# Patient Record
Sex: Female | Born: 1990 | Race: White | Hispanic: No | Smoking: Former smoker
Health system: Southern US, Community
[De-identification: ages and names within clinical notes are randomized; demographics above are authoritative.]

## PROBLEM LIST (undated history)

## (undated) DIAGNOSIS — F909 Attention-deficit hyperactivity disorder, unspecified type: Secondary | ICD-10-CM

## (undated) DIAGNOSIS — R51 Headache: Secondary | ICD-10-CM

## (undated) DIAGNOSIS — F419 Anxiety disorder, unspecified: Secondary | ICD-10-CM

## (undated) DIAGNOSIS — F32A Depression, unspecified: Secondary | ICD-10-CM

## (undated) DIAGNOSIS — E282 Polycystic ovarian syndrome: Secondary | ICD-10-CM

## (undated) DIAGNOSIS — F329 Major depressive disorder, single episode, unspecified: Secondary | ICD-10-CM

## (undated) DIAGNOSIS — R569 Unspecified convulsions: Secondary | ICD-10-CM

## (undated) HISTORY — PX: WISDOM TOOTH EXTRACTION: SHX21

---
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
## 2012-02-29 ENCOUNTER — Encounter (HOSPITAL_COMMUNITY): Payer: Self-pay | Admitting: *Deleted

## 2012-02-29 ENCOUNTER — Emergency Department (HOSPITAL_COMMUNITY)
Admission: EM | Admit: 2012-02-29 | Discharge: 2012-02-29 | Disposition: A | Discharge status: Home / Self Care | Payer: 59 | Attending: Emergency Medicine | Admitting: Emergency Medicine

## 2012-02-29 DIAGNOSIS — F411 Generalized anxiety disorder: Secondary | ICD-10-CM | POA: Insufficient documentation

## 2012-02-29 DIAGNOSIS — F329 Major depressive disorder, single episode, unspecified: Secondary | ICD-10-CM | POA: Insufficient documentation

## 2012-02-29 DIAGNOSIS — R109 Unspecified abdominal pain: Secondary | ICD-10-CM | POA: Insufficient documentation

## 2012-02-29 DIAGNOSIS — R339 Retention of urine, unspecified: Secondary | ICD-10-CM | POA: Insufficient documentation

## 2012-02-29 DIAGNOSIS — Z79899 Other long term (current) drug therapy: Secondary | ICD-10-CM | POA: Insufficient documentation

## 2012-02-29 DIAGNOSIS — R51 Headache: Secondary | ICD-10-CM | POA: Insufficient documentation

## 2012-02-29 DIAGNOSIS — J45909 Unspecified asthma, uncomplicated: Secondary | ICD-10-CM | POA: Insufficient documentation

## 2012-02-29 DIAGNOSIS — Z3202 Encounter for pregnancy test, result negative: Secondary | ICD-10-CM | POA: Insufficient documentation

## 2012-02-29 DIAGNOSIS — F17200 Nicotine dependence, unspecified, uncomplicated: Secondary | ICD-10-CM | POA: Insufficient documentation

## 2012-02-29 DIAGNOSIS — F3289 Other specified depressive episodes: Secondary | ICD-10-CM | POA: Insufficient documentation

## 2012-02-29 LAB — URINALYSIS, ROUTINE W REFLEX MICROSCOPIC
Hgb urine dipstick: NEGATIVE
Nitrite: NEGATIVE
Protein, ur: NEGATIVE mg/dL
Specific Gravity, Urine: 1.006 (ref 1.005–1.030)
Urobilinogen, UA: 0.2 mg/dL (ref 0.0–1.0)

## 2012-02-29 LAB — PREGNANCY, URINE: Preg Test, Ur: NEGATIVE

## 2012-02-29 MED ORDER — HYDROCODONE-ACETAMINOPHEN 5-325 MG PO TABS
2.0000 | ORAL_TABLET | Freq: Once | ORAL | Status: AC
  Administered 2012-02-29: 2 via ORAL
  Filled 2012-02-29: qty 2

## 2012-02-29 NOTE — ED Notes (Signed)
Pt did not wish to go home with catheter, requested removal. Explained that pt may need to return to have catheter place due to retention again and the risk for infection. Pt verbalized understanding and requested removal. Marlon Pel, PA made aware. Catheter removed.

## 2012-02-29 NOTE — ED Notes (Signed)
Pt. States she was able to void without any problems.

## 2012-02-29 NOTE — ED Provider Notes (Signed)
History     CSN: [id number]  Arrival date & time 02/29/12  1935   None     Chief Complaint  Patient presents with  . unable to void     (Consider location/radiation/quality/duration/timing/severity/associated sxs/prior treatment) HPI  Patient presents to the ER for urinary retention. She denies having this problem in the past. She takes Klonopin, Adder all, albuterol and Advil. No other medications. She has not urinated since last night and had the urge to urinate but was unable to. Denies being pregnant, denies having dysuria or foul odor to urine. Pt otherwise healthy.  Past Medical History  Diagnosis Date  . Asthma   . Depression   . Anxiety   . Headache     History reviewed. No pertinent past surgical history.  No family history on file.  History  Substance Use Topics  . Smoking status: Current Some Day Smoker -- 0.2 packs/day    Types: Cigarettes  . Smokeless tobacco: Never Used  . Alcohol Use: No    OB History    Grav Para Term Preterm Abortions TAB SAB Ect Mult Living                  Review of Systems  Genitourinary: Positive for difficulty urinating.  All other systems reviewed and are negative.    Allergies  Review of patient's allergies indicates no known allergies.  Home Medications   Current Outpatient Rx  Name  Route  Sig  Dispense  Refill  . ALBUTEROL SULFATE HFA 108 (90 BASE) MCG/ACT IN AERS   Inhalation   Inhale 2 puffs into the lungs every 6 (six) hours as needed. For shortness of breath         . AMPHETAMINE-DEXTROAMPHETAMINE 30 MG PO TABS   Oral   Take 30 mg by mouth 2 (two) times daily.           Marland Kitchen CLONAZEPAM 0.5 MG PO TABS   Oral   Take 1 mg by mouth 2 (two) times daily.          . IBUPROFEN 200 MG PO TABS   Oral   Take 200 mg by mouth every 6 (six) hours as needed. For pain           BP 117/65  Pulse 78  Temp 98 F (36.7 C) (Oral)  Resp 14  SpO2 98%  LMP 02/08/2012  Physical Exam  Nursing note and  vitals reviewed. Constitutional: She appears well-developed and well-nourished. No distress.  HENT:  Head: Normocephalic and atraumatic.  Eyes: Pupils are equal, round, and reactive to light.  Neck: Normal range of motion. Neck supple.  Cardiovascular: Normal rate and regular rhythm.   Pulmonary/Chest: Effort normal.  Abdominal: Soft. There is tenderness in the suprapubic area.  Neurological: She is alert.  Skin: Skin is warm and dry.    ED Course  Procedures (including critical care time)   Labs Reviewed  URINALYSIS, ROUTINE W REFLEX MICROSCOPIC  PREGNANCY, URINE  URINE CULTURE   No results found.   1. Urinary retention       MDM  Discussed issues medications with pharmacist. She says to the best of her knowledge Klonopin and Adderall do not cause urinary retention. Foley catheter and leg bag in place. Urinalysis and urine preg and now.  Patient had 500 mL of urine drained from her bladder. I have discussed with her that she will need to followup with urology did call their office tomorrow morning to schedule the appointment.  Patient is feeling much better and all her discomfort has resolved.  Pt has been advised of the symptoms that warrant their return to the ED. Patient has voiced understanding and has agreed to follow-up with the PCP or specialist.   Dorthula Matas, PA 02/29/12 2120  After discharge patient decided she did not want to take the leg back and foley with her. The nurse and I discussed how she may not be able to pee and will need to come back to the ER for this. She says that she understands and still wants it taken out. I had Garment/textile technologist, review all of the risks with patient. She chose to have it taken out. Pt urinated on her own before leaving hospital.  Dorthula Matas, PA 02/29/12 2216

## 2012-02-29 NOTE — ED Provider Notes (Signed)
Medical screening examination/treatment/procedure(s) were performed by non-physician practitioner and as supervising physician I was immediately available for consultation/collaboration.   Gwyneth Sprout, MD 02/29/12 [phone]

## 2012-02-29 NOTE — ED Notes (Signed)
The pt has had difficulty voiding  Since yesterday and the last time she voided  Was yesterday.  She is very uncomfortable.  No previous history.  lmp  Last month

## 2012-03-02 LAB — URINE CULTURE
Colony Count: NO GROWTH
Culture: NO GROWTH

## 2012-03-11 ENCOUNTER — Encounter (HOSPITAL_COMMUNITY): Payer: Self-pay | Admitting: Emergency Medicine

## 2012-03-11 ENCOUNTER — Emergency Department (HOSPITAL_COMMUNITY)
Admission: EM | Admit: 2012-03-11 | Discharge: 2012-03-11 | Disposition: A | Discharge status: Home / Self Care | Payer: 59 | Attending: Emergency Medicine | Admitting: Emergency Medicine

## 2012-03-11 DIAGNOSIS — J45909 Unspecified asthma, uncomplicated: Secondary | ICD-10-CM | POA: Insufficient documentation

## 2012-03-11 DIAGNOSIS — F411 Generalized anxiety disorder: Secondary | ICD-10-CM | POA: Insufficient documentation

## 2012-03-11 DIAGNOSIS — F1110 Opioid abuse, uncomplicated: Secondary | ICD-10-CM | POA: Insufficient documentation

## 2012-03-11 DIAGNOSIS — M7989 Other specified soft tissue disorders: Secondary | ICD-10-CM | POA: Insufficient documentation

## 2012-03-11 DIAGNOSIS — Z79899 Other long term (current) drug therapy: Secondary | ICD-10-CM | POA: Insufficient documentation

## 2012-03-11 DIAGNOSIS — Z8781 Personal history of (healed) traumatic fracture: Secondary | ICD-10-CM | POA: Insufficient documentation

## 2012-03-11 DIAGNOSIS — M238X9 Other internal derangements of unspecified knee: Secondary | ICD-10-CM

## 2012-03-11 DIAGNOSIS — M2350 Chronic instability of knee, unspecified knee: Secondary | ICD-10-CM | POA: Insufficient documentation

## 2012-03-11 DIAGNOSIS — F3289 Other specified depressive episodes: Secondary | ICD-10-CM | POA: Insufficient documentation

## 2012-03-11 DIAGNOSIS — F17200 Nicotine dependence, unspecified, uncomplicated: Secondary | ICD-10-CM | POA: Insufficient documentation

## 2012-03-11 DIAGNOSIS — G8929 Other chronic pain: Secondary | ICD-10-CM | POA: Insufficient documentation

## 2012-03-11 DIAGNOSIS — M549 Dorsalgia, unspecified: Secondary | ICD-10-CM | POA: Insufficient documentation

## 2012-03-11 DIAGNOSIS — Z8669 Personal history of other diseases of the nervous system and sense organs: Secondary | ICD-10-CM | POA: Insufficient documentation

## 2012-03-11 DIAGNOSIS — M25569 Pain in unspecified knee: Secondary | ICD-10-CM | POA: Insufficient documentation

## 2012-03-11 DIAGNOSIS — F329 Major depressive disorder, single episode, unspecified: Secondary | ICD-10-CM | POA: Insufficient documentation

## 2012-03-11 MED ORDER — TRAMADOL HCL 50 MG PO TABS: 50.0000 mg | ORAL_TABLET | Freq: Four times a day (QID) | ORAL | Status: DC | PRN

## 2012-03-11 MED ORDER — TRAMADOL HCL 50 MG PO TABS
50.0000 mg | ORAL_TABLET | Freq: Once | ORAL | Status: AC
  Administered 2012-03-11: 50 mg via ORAL
  Filled 2012-03-11: qty 1

## 2012-03-11 NOTE — ED Provider Notes (Signed)
Medical screening examination/treatment/procedure(s) were performed by non-physician practitioner and as supervising physician I was immediately available for consultation/collaboration.   Dione Booze, MD 03/11/12 [phone]

## 2012-03-11 NOTE — ED Provider Notes (Signed)
History   This chart was scribed for non-physician practitioner working with Shelby Booze, MD by Shelby Branch, ED Scribe. This patient was seen in room WTR8/WTR8 and the patient's care was started at 3:50 PM.    CSN: [id number]  Arrival date & time 03/11/12  1515   First MD Initiated Contact with Patient 03/11/12 1533      Chief Complaint  Patient presents with  . Knee Pain     The history is provided by the patient. No language interpreter was used.   Shelby Branch is a 22 y.o. female with h/o asthma, seizures and heroine abuse who presents to the Emergency Department complaining of constant, gradually worsening, throbbing and stabbing left knee pain that fluctuates between 6/10 and 8/10 in severity and does not radiate that began last night preventing sleep and has been notably worse today.  Pt states she tore her MCL 09/2011 falling down flight of stairs and has been performing physical therapy since but has not had surgical intervention, which she has been told is her only option for a full recovery (MRI 11/2011).  Pt denies any falls or re-injuries since 09/2011 and is unsure why pain worsened last night.   Pt reports associated swelling in right foot.  Pt denies recent fever and states she has chronic back pain that has not recently worsened.  Pt is a current someday smoker and denies alcohol use.  Past Medical History  Diagnosis Date  . Asthma   . Depression   . Anxiety   . Headache     History reviewed. No pertinent past surgical history.  No family history on file.  History  Substance Use Topics  . Smoking status: Current Some Day Smoker -- 0.2 packs/day    Types: Cigarettes  . Smokeless tobacco: Never Used  . Alcohol Use: No    No OB history provided.   Review of Systems  Constitutional: Negative for fever, diaphoresis, appetite change, fatigue and unexpected weight change.  HENT: Negative for mouth sores and neck stiffness.   Eyes: Negative for visual disturbance.   Respiratory: Negative for cough, chest tightness, shortness of breath and wheezing.   Cardiovascular: Negative for chest pain.  Gastrointestinal: Negative for nausea, vomiting, abdominal pain, diarrhea and constipation.  Genitourinary: Negative for dysuria, urgency, frequency and hematuria.  Musculoskeletal: Positive for joint swelling, arthralgias and gait problem (2/2 pain). Negative for back pain.  Skin: Negative for rash.  Neurological: Negative for syncope, light-headedness and headaches.  Hematological: Does not bruise/bleed easily.  Psychiatric/Behavioral: Negative for sleep disturbance. The patient is not nervous/anxious.   All other systems reviewed and are negative.    Allergies  Review of patient's allergies indicates no known allergies.  Home Medications   Current Outpatient Rx  Name  Route  Sig  Dispense  Refill  . AMPHETAMINE-DEXTROAMPHET ER 30 MG PO CP24   Oral   Take 30 mg by mouth 2 (two) times daily.         Marland Kitchen CLONAZEPAM 0.5 MG PO TABS   Oral   Take 0.5 mg by mouth 4 (four) times daily as needed. Anxiety.         Marland Kitchen DOXYLAMINE SUCCINATE (SLEEP) 25 MG PO TABS   Oral   Take 25 mg by mouth at bedtime as needed. Sleep.         . ALBUTEROL SULFATE HFA 108 (90 BASE) MCG/ACT IN AERS   Inhalation   Inhale 2 puffs into the lungs every 6 (six) hours as  needed. For shortness of breath         . TRAMADOL HCL 50 MG PO TABS   Oral   Take 1 tablet (50 mg total) by mouth every 6 (six) hours as needed for pain.   15 tablet   0     BP 100/78  Pulse 65  Temp 97.7 F (36.5 C)  Resp 16  SpO2 100%  LMP 02/08/2012  Physical Exam  Nursing note and vitals reviewed. Constitutional: She appears well-developed and well-nourished. No distress.  HENT:  Head: Normocephalic and atraumatic.  Mouth/Throat: Oropharynx is clear and moist. No oropharyngeal exudate.  Eyes: Conjunctivae normal are normal. No scleral icterus.  Neck: Normal range of motion. Neck supple.   Cardiovascular: Normal rate, regular rhythm, normal heart sounds and intact distal pulses.  Exam reveals no gallop and no friction rub.   No murmur heard. Pulses:      Dorsalis pedis pulses are 2+ on the left side.       Posterior tibial pulses are 2+ on the left side.       Capillary refill < 3 sec  Pulmonary/Chest: Effort normal and breath sounds normal. No respiratory distress. She has no wheezes.  Musculoskeletal: Normal range of motion. She exhibits no edema.       Pain to palpation in popliteal area without palpable swelling medial joint line tenderness to palpation No swelling of the L ankle   Neurological: She is alert.       Speech is clear and goal oriented Moves extremities without ataxia Full ROM of the Left toes, ankle and knee 5/5 strength in the ankle and knee of the LLE and including dorsiflexion and plantarflexion sensation intact in the LLE Increased laxity with valgus stress of the L knee  Skin: Skin is warm and dry. She is not diaphoretic.  Psychiatric: She has a normal mood and affect.    ED Course  Procedures (including critical care time) DIAGNOSTIC STUDIES: Oxygen Saturation is 100% on room air, normal by my interpretation.    COORDINATION OF CARE: 3:59 PM- Informed pt that knee XR will not be helpful today and that follow-up with orthopedist is necessary.  Discussed short-term pain medication.  Pt understands and agrees with plan.  1. MCL deficiency, knee   2. Knee pain       MDM  Shelby Branch presents with knee pain months after injury. Patient denies fall or trauma to cause increase in knee pain. Rebound and injury do not believe imaging is required at this time. We'll manage her pain here in the department and discharged home with Ultram for pain control. Discussed continuing her physical therapy along with RICE and stretching. Patient is to also continue using her brace. I also discussed the need for followup with orthopedist, Dr Althea Charon for  further evaluation.  1. Medications: ultram, usual home medications 2. Treatment: rest, drink plenty of fluids, RICE protocol 3. Follow Up: Please followup with your primary doctor for discussion of your diagnoses and further evaluation after today's visit; f/u with Dr Althea Charon for further evaluation of your knee  I personally performed the services described in this documentation, which was scribed in my presence. The recorded information has been reviewed and is accurate.      Dahlia Client Garlen Reinig, PA-C 03/11/12 [phone]

## 2012-03-11 NOTE — ED Notes (Signed)
Pt reports 7/10 L knee pain.  Pt reports that in aug 2013 she fell down a flight of stairs and tore her MCL.

## 2012-04-07 ENCOUNTER — Emergency Department (HOSPITAL_COMMUNITY)
Admission: EM | Admit: 2012-04-07 | Discharge: 2012-04-08 | Disposition: A | Discharge status: Home / Self Care | Payer: 59 | Attending: Emergency Medicine | Admitting: Emergency Medicine

## 2012-04-07 ENCOUNTER — Encounter (HOSPITAL_COMMUNITY): Payer: Self-pay | Admitting: *Deleted

## 2012-04-07 DIAGNOSIS — F411 Generalized anxiety disorder: Secondary | ICD-10-CM | POA: Insufficient documentation

## 2012-04-07 DIAGNOSIS — F329 Major depressive disorder, single episode, unspecified: Secondary | ICD-10-CM | POA: Insufficient documentation

## 2012-04-07 DIAGNOSIS — F3289 Other specified depressive episodes: Secondary | ICD-10-CM | POA: Insufficient documentation

## 2012-04-07 DIAGNOSIS — R21 Rash and other nonspecific skin eruption: Secondary | ICD-10-CM | POA: Insufficient documentation

## 2012-04-07 DIAGNOSIS — J45909 Unspecified asthma, uncomplicated: Secondary | ICD-10-CM | POA: Insufficient documentation

## 2012-04-07 DIAGNOSIS — N920 Excessive and frequent menstruation with regular cycle: Secondary | ICD-10-CM | POA: Insufficient documentation

## 2012-04-07 DIAGNOSIS — F17200 Nicotine dependence, unspecified, uncomplicated: Secondary | ICD-10-CM | POA: Insufficient documentation

## 2012-04-07 DIAGNOSIS — Z79899 Other long term (current) drug therapy: Secondary | ICD-10-CM | POA: Insufficient documentation

## 2012-04-07 NOTE — ED Notes (Addendum)
Pt states area does itch; also c/o heavy menstrual bleeding

## 2012-04-07 NOTE — ED Notes (Signed)
Pt c/o bumps all over; started in June when pt was using heroin; progressively gotten worse; hasn't used since June; has red scabbed areas on chest/back/face/legs

## 2012-04-08 LAB — POCT I-STAT, CHEM 8
Calcium, Ion: 1.25 mmol/L — ABNORMAL HIGH (ref 1.12–1.23)
Creatinine, Ser: 1 mg/dL (ref 0.50–1.10)
Glucose, Bld: 114 mg/dL — ABNORMAL HIGH (ref 70–99)
HCT: 35 % — ABNORMAL LOW (ref 36.0–46.0)
Hemoglobin: 11.9 g/dL — ABNORMAL LOW (ref 12.0–15.0)
TCO2: 29 mmol/L (ref 0–100)

## 2012-04-08 MED ORDER — PERMETHRIN 5 % EX CREA: TOPICAL_CREAM | CUTANEOUS | Status: DC

## 2012-04-08 MED ORDER — DIPHENHYDRAMINE HCL 25 MG PO CAPS
25.0000 mg | ORAL_CAPSULE | Freq: Once | ORAL | Status: AC
  Administered 2012-04-08: 25 mg via ORAL
  Filled 2012-04-08: qty 1

## 2012-04-08 NOTE — ED Provider Notes (Signed)
History     CSN: [id number]  Arrival date & time 04/07/12  2341   First MD Initiated Contact with Patient 04/08/12 0104      Chief Complaint  Patient presents with  . Rash    The history is provided by the patient.   patient reports developing a rash all over her arms chest and legs has been present since June of 2013.  She reports it itches.  She has pets that live in the house.  She lives in a house by herself.  She states she is off several phleboliths and this hasn't changed anything.  She has no history of eczema or asthma.  She also reports heavy menstrual cycle this week with more bleeding than normal.  She denies lower abdominal pain.  Symptoms are mild in severity.  Nothing worsens or improves her symptoms  Past Medical History  Diagnosis Date  . Asthma   . Depression   . Anxiety   . Headache     History reviewed. No pertinent past surgical history.  No family history on file.  History  Substance Use Topics  . Smoking status: Current Some Day Smoker -- 0.25 packs/day    Types: Cigarettes  . Smokeless tobacco: Never Used  . Alcohol Use: No    OB History   Grav Para Term Preterm Abortions TAB SAB Ect Mult Living                  Review of Systems  Skin: Positive for rash.  All other systems reviewed and are negative.    Allergies  Review of patient's allergies indicates no known allergies.  Home Medications   Current Outpatient Rx  Name  Route  Sig  Dispense  Refill  . amphetamine-dextroamphetamine (ADDERALL XR) 30 MG 24 hr capsule   Oral   Take 30 mg by mouth 2 (two) times daily.         . clonazePAM (KLONOPIN) 0.5 MG tablet   Oral   Take 0.5 mg by mouth 4 (four) times daily as needed. Anxiety.         Marland Kitchen HYDROcodone-ibuprofen (VICOPROFEN) 7.5-200 MG per tablet   Oral   Take 1 tablet by mouth every 4 (four) hours as needed for pain. For torn mcl         . naproxen sodium (ALEVE) 220 MG tablet   Oral   Take 440 mg by mouth 2 (two) times  daily as needed. For headache pain & bumps on back         . albuterol (PROVENTIL HFA;VENTOLIN HFA) 108 (90 BASE) MCG/ACT inhaler   Inhalation   Inhale 2 puffs into the lungs every 6 (six) hours as needed. For shortness of breath         . permethrin (ACTICIN) 5 % cream      Massage the cream into skin from the head to your feet Leave the permethrin cream on the skin for 8 to 14 hours. Wash off by taking a shower or bath. Put on clean clothes. Repeat in 1 week Launder all clothes, sheets and towels   60 g   0     BP 128/82  Pulse 98  Temp(Src) 98.5 F (36.9 C) (Oral)  Resp 15  SpO2 99%  LMP 04/07/2012  Physical Exam  Nursing note and vitals reviewed. Constitutional: She is oriented to person, place, and time. She appears well-developed and well-nourished. No distress.  HENT:  Head: Normocephalic and atraumatic.  Eyes: EOM  are normal.  Neck: Normal range of motion.  Cardiovascular: Normal rate, regular rhythm and normal heart sounds.   Pulmonary/Chest: Effort normal and breath sounds normal.  Abdominal: Soft. She exhibits no distension. There is no tenderness.  Musculoskeletal: Normal range of motion.  Neurological: She is alert and oriented to person, place, and time.  Skin: Skin is warm and dry.  Punctate sores all over her arms chest and back.  Evidence of excoriation.  No surrounding erythema  Psychiatric: She has a normal mood and affect. Judgment normal.    ED Course  Procedures (including critical care time)  Labs Reviewed  POCT I-STAT, CHEM 8 - Abnormal; Notable for the following:    Glucose, Bld 114 (*)    Calcium, Ion 1.25 (*)    Hemoglobin 11.9 (*)    HCT 35.0 (*)    All other components within normal limits  POCT PREGNANCY, URINE   No results found.   1. Rash   2. Menorrhagia       MDM  Nonspecific rash.  May represent fleas, scabies, bed bugs.  We'll treat the scabies.  Benadryl recommended.  Heavy menstrual cycle this month the urine  pregnancy is negative.  Vital signs are normal.  Hemoglobin demonstrates only very mild anemia        Lyanne Co, MD 04/08/12 [phone]

## 2012-04-08 NOTE — ED Notes (Signed)
Pt states she is unable to urinate at this time.

## 2012-04-18 ENCOUNTER — Emergency Department (HOSPITAL_COMMUNITY)
Admission: EM | Admit: 2012-04-18 | Discharge: 2012-04-18 | Disposition: A | Discharge status: Home / Self Care | Payer: 59 | Attending: Emergency Medicine | Admitting: Emergency Medicine

## 2012-04-18 DIAGNOSIS — R569 Unspecified convulsions: Secondary | ICD-10-CM

## 2012-04-18 DIAGNOSIS — G40909 Epilepsy, unspecified, not intractable, without status epilepticus: Secondary | ICD-10-CM | POA: Insufficient documentation

## 2012-04-18 DIAGNOSIS — F411 Generalized anxiety disorder: Secondary | ICD-10-CM | POA: Insufficient documentation

## 2012-04-18 DIAGNOSIS — Z8679 Personal history of other diseases of the circulatory system: Secondary | ICD-10-CM | POA: Insufficient documentation

## 2012-04-18 DIAGNOSIS — F329 Major depressive disorder, single episode, unspecified: Secondary | ICD-10-CM | POA: Insufficient documentation

## 2012-04-18 DIAGNOSIS — F17200 Nicotine dependence, unspecified, uncomplicated: Secondary | ICD-10-CM | POA: Insufficient documentation

## 2012-04-18 DIAGNOSIS — J45909 Unspecified asthma, uncomplicated: Secondary | ICD-10-CM | POA: Insufficient documentation

## 2012-04-18 DIAGNOSIS — Z79899 Other long term (current) drug therapy: Secondary | ICD-10-CM | POA: Insufficient documentation

## 2012-04-18 DIAGNOSIS — F3289 Other specified depressive episodes: Secondary | ICD-10-CM | POA: Insufficient documentation

## 2012-04-18 MED ORDER — LORAZEPAM 2 MG/ML IJ SOLN
1.0000 mg | Freq: Once | INTRAMUSCULAR | Status: AC
  Administered 2012-04-18: 1 mg via INTRAVENOUS
  Filled 2012-04-18: qty 1

## 2012-04-18 MED ORDER — CLONAZEPAM 0.5 MG PO TABS: 0.5000 mg | ORAL_TABLET | Freq: Four times a day (QID) | ORAL | Status: DC | PRN

## 2012-04-18 NOTE — ED Notes (Addendum)
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 Abrasions to face where her hands were during time of seizure. Has had one seizure before. CBG 98. 20 LAC. Postictal on arrival.

## 2012-04-18 NOTE — ED Notes (Signed)
WGN:FAOZ<HY> Expected date:<BR> Expected time:<BR> Means of arrival:<BR> Comments:<BR> seizure

## 2012-04-19 NOTE — ED Provider Notes (Signed)
History     CSN: [id number]  Arrival date & time 04/18/12  1636   First MD Initiated Contact with Patient 04/18/12 1653      Chief Complaint  Patient presents with  . Seizures    The history is provided by medical records and the patient.   the patient has a history of benzodiazepine withdrawal seizures.  She presents today with a seizure after not having any benzodiazepines in the past 48 hours.  Her blood sugar for EMS was 98.  The patient feels much better at this time.  She denies neck pain or headache.  No weakness of her upper lower extremities.  She does report a small bite to her left tongue.  She has no other complaints.  She was hospitalized once before for similar episode.  At that time she seen by neurology and this is thought to be benzodiazepine withdrawal seizure.  Her EEG was without any epileptic forms.  The patient's symptoms Result  Past Medical History  Diagnosis Date  . Asthma   . Depression   . Anxiety   . Headache     No past surgical history on file.  No family history on file.  History  Substance Use Topics  . Smoking status: Current Some Day Smoker -- 0.25 packs/day    Types: Cigarettes  . Smokeless tobacco: Never Used  . Alcohol Use: No    OB History   Grav Para Term Preterm Abortions TAB SAB Ect Mult Living                  Review of Systems  All other systems reviewed and are negative.    Allergies  Review of patient's allergies indicates no known allergies.  Home Medications   Current Outpatient Rx  Name  Route  Sig  Dispense  Refill  . amphetamine-dextroamphetamine (ADDERALL XR) 30 MG 24 hr capsule   Oral   Take 30 mg by mouth 2 (two) times daily.         . clonazePAM (KLONOPIN) 0.5 MG tablet   Oral   Take 1 tablet (0.5 mg total) by mouth 4 (four) times daily as needed. Anxiety.   6 tablet   0     BP 113/68  Pulse 92  Temp(Src) 99.1 F (37.3 C) (Oral)  Resp 17  SpO2 97%  LMP 04/07/2012  Physical Exam  Nursing  note and vitals reviewed. Constitutional: She is oriented to person, place, and time. She appears well-developed and well-nourished. No distress.  HENT:  Head: Normocephalic and atraumatic.  Eyes: EOM are normal.  Neck: Normal range of motion.  Cardiovascular: Normal rate, regular rhythm and normal heart sounds.   Pulmonary/Chest: Effort normal and breath sounds normal.  Abdominal: Soft. She exhibits no distension. There is no tenderness.  Musculoskeletal: Normal range of motion.  Neurological: She is alert and oriented to person, place, and time.  Skin: Skin is warm and dry.  Psychiatric: She has a normal mood and affect. Judgment normal.    ED Course  Procedures (including critical care time)  Labs Reviewed - No data to display No results found.  Date: 04/19/2012  Rate: 101  Rhythm: normal sinus rhythm  QRS Axis: normal  Intervals: normal  ST/T Wave abnormalities: normal  Conduction Disutrbances: none  Narrative Interpretation:   Old EKG Reviewed: No significant changes noted     1. Seizure       MDM  This may be Bazley withdrawal seizure.  The patient is  on benzodiazepines at home.  She will be prescribed a very short course of Klonopin.  Ativan emergency apartment.  She feels much better at time of discharge.  No recent head trauma.  No indication for labs or imaging of her head.  Her prior EEG was negative and this is thought to be benzo withdrawal seizures in the past.        Lyanne Co, MD 04/19/12 [phone]

## 2012-08-27 ENCOUNTER — Emergency Department (HOSPITAL_COMMUNITY): Payer: 59

## 2012-08-27 ENCOUNTER — Observation Stay (HOSPITAL_COMMUNITY)
Admission: EM | Admit: 2012-08-27 | Discharge: 2012-08-30 | Disposition: A | Discharge status: Home / Self Care | Payer: 59 | Attending: Internal Medicine | Admitting: Internal Medicine

## 2012-08-27 ENCOUNTER — Encounter (HOSPITAL_COMMUNITY): Payer: Self-pay | Admitting: Emergency Medicine

## 2012-08-27 DIAGNOSIS — Z23 Encounter for immunization: Secondary | ICD-10-CM | POA: Insufficient documentation

## 2012-08-27 DIAGNOSIS — G40802 Other epilepsy, not intractable, without status epilepticus: Principal | ICD-10-CM | POA: Insufficient documentation

## 2012-08-27 DIAGNOSIS — F1310 Sedative, hypnotic or anxiolytic abuse, uncomplicated: Secondary | ICD-10-CM | POA: Insufficient documentation

## 2012-08-27 DIAGNOSIS — F1320 Sedative, hypnotic or anxiolytic dependence, uncomplicated: Secondary | ICD-10-CM

## 2012-08-27 DIAGNOSIS — F1110 Opioid abuse, uncomplicated: Secondary | ICD-10-CM | POA: Insufficient documentation

## 2012-08-27 DIAGNOSIS — R569 Unspecified convulsions: Secondary | ICD-10-CM | POA: Diagnosis present

## 2012-08-27 DIAGNOSIS — E876 Hypokalemia: Secondary | ICD-10-CM | POA: Insufficient documentation

## 2012-08-27 DIAGNOSIS — F329 Major depressive disorder, single episode, unspecified: Secondary | ICD-10-CM | POA: Diagnosis present

## 2012-08-27 DIAGNOSIS — W19XXXA Unspecified fall, initial encounter: Secondary | ICD-10-CM | POA: Insufficient documentation

## 2012-08-27 DIAGNOSIS — F3289 Other specified depressive episodes: Secondary | ICD-10-CM | POA: Insufficient documentation

## 2012-08-27 DIAGNOSIS — F411 Generalized anxiety disorder: Secondary | ICD-10-CM | POA: Insufficient documentation

## 2012-08-27 DIAGNOSIS — S0003XA Contusion of scalp, initial encounter: Secondary | ICD-10-CM | POA: Insufficient documentation

## 2012-08-27 DIAGNOSIS — F1910 Other psychoactive substance abuse, uncomplicated: Secondary | ICD-10-CM

## 2012-08-27 LAB — CBC WITH DIFFERENTIAL/PLATELET
Basophils Absolute: 0 10*3/uL (ref 0.0–0.1)
Basophils Relative: 0 % (ref 0–1)
Eosinophils Absolute: 0.1 10*3/uL (ref 0.0–0.7)
Eosinophils Relative: 1 % (ref 0–5)
Lymphs Abs: 1.5 10*3/uL (ref 0.7–4.0)
MCH: 30.3 pg (ref 26.0–34.0)
MCHC: 32.8 g/dL (ref 30.0–36.0)
MCV: 92.2 fL (ref 78.0–100.0)
Platelets: 245 10*3/uL (ref 150–400)
RDW: 13.2 % (ref 11.5–15.5)

## 2012-08-27 LAB — BASIC METABOLIC PANEL
Calcium: 6.4 mg/dL — CL (ref 8.4–10.5)
GFR calc Af Amer: >90 mL/min (ref >90)
GFR calc non Af Amer: >90 mL/min (ref >90)
Glucose, Bld: 63 mg/dL — ABNORMAL LOW (ref 70–99)
Sodium: 138 mEq/L (ref 135–145)

## 2012-08-27 LAB — URINE MICROSCOPIC-ADD ON

## 2012-08-27 LAB — URINALYSIS, ROUTINE W REFLEX MICROSCOPIC
Hgb urine dipstick: NEGATIVE
Protein, ur: NEGATIVE mg/dL
Urobilinogen, UA: 0.2 mg/dL (ref 0.0–1.0)

## 2012-08-27 LAB — PREGNANCY, URINE: Preg Test, Ur: NEGATIVE

## 2012-08-27 LAB — ETHANOL: Alcohol, Ethyl (B): <11 mg/dL (ref 0–11)

## 2012-08-27 LAB — RAPID URINE DRUG SCREEN, HOSP PERFORMED
Amphetamines: NOT DETECTED
Tetrahydrocannabinol: NOT DETECTED

## 2012-08-27 MED ORDER — SODIUM CHLORIDE 0.9 % IV SOLN
INTRAVENOUS | Status: AC
  Administered 2012-08-27: 20:00:00 via INTRAVENOUS

## 2012-08-27 MED ORDER — SODIUM CHLORIDE 0.9 % IV SOLN
1000.0000 mg | INTRAVENOUS | Status: AC
  Administered 2012-08-27: 1000 mg via INTRAVENOUS
  Filled 2012-08-27: qty 10

## 2012-08-27 MED ORDER — ALPRAZOLAM 0.25 MG PO TABS: 0.5000 mg | ORAL_TABLET | Freq: Four times a day (QID) | ORAL | Status: DC

## 2012-08-27 MED ORDER — ACETAMINOPHEN 325 MG PO TABS
650.0000 mg | ORAL_TABLET | Freq: Once | ORAL | Status: AC
  Administered 2012-08-27: 650 mg via ORAL
  Filled 2012-08-27: qty 2

## 2012-08-27 NOTE — ED Notes (Signed)
Pt unable to void at this time. 

## 2012-08-27 NOTE — ED Provider Notes (Addendum)
History    CSN: [id number] Arrival date & time 08/27/12  1652  First MD Initiated Contact with Patient 08/27/12 1722     Chief Complaint  Patient presents with  . Seizures   (Consider location/radiation/quality/duration/timing/severity/associated sxs/prior Treatment) HPI Comments: Shelby Branch is a 22 y.o. female who is here for evaluation of 2, witnessed seizures. The first one was last night at 1 AM and the second about 4 PM today. Each time. She had generalized tonic-clonic activity for 45 seconds followed by a 1-2 minute post to stay and a more prolonged confusional state. The first incident occurred while seated in a recliner. A second incident occurred while standing. She injured her head of the second episode. There were no preceding symptoms. She ran out of her Xanax 2 days ago because she took excess Xanax. She is prescribed Xanax for anxiety, by her psychiatrist. She's never tried weaning off the Xanax. She has had similar seizures, in the past. She denies recent illnesses. She any fever, chills, nausea, vomiting, weakness, or dizziness. There are no other known modifying factors.  Patient is a 22 y.o. female presenting with seizures. The history is provided by the patient and a parent.  Seizures  Past Medical History  Diagnosis Date  . Asthma   . Depression   . Anxiety   . Headache(784.0)    History reviewed. No pertinent past surgical history. History reviewed. No pertinent family history. History  Substance Use Topics  . Smoking status: Current Some Day Smoker -- 0.25 packs/day    Types: Cigarettes  . Smokeless tobacco: Never Used  . Alcohol Use: No   OB History   Grav Para Term Preterm Abortions TAB SAB Ect Mult Living                 Review of Systems  Neurological: Positive for seizures.    Allergies  Review of patient's allergies indicates no known allergies.  Home Medications   Current Outpatient Rx  Name  Route  Sig  Dispense  Refill  . ALPRAZolam  (XANAX) 0.5 MG tablet   Oral   Take 0.5 mg by mouth every 6 (six) hours as needed for anxiety.          BP 119/88  Pulse 83  Temp(Src) 97.5 F (36.4 C) (Oral)  Resp 16  SpO2 100%  LMP 06/27/2012 Physical Exam  Nursing note and vitals reviewed. Constitutional: She is oriented to person, place, and time. She appears well-developed and well-nourished.  HENT:  Head: Normocephalic.  No significant tongue abrasion. Contusion right parietal scalp, without bleeding.  Eyes: Conjunctivae and EOM are normal. Pupils are equal, round, and reactive to light.  Neck: Normal range of motion and phonation normal. Neck supple.  Cardiovascular: Normal rate, regular rhythm and intact distal pulses.   Pulmonary/Chest: Effort normal and breath sounds normal. She exhibits no tenderness.  Abdominal: Soft. She exhibits no distension. There is no tenderness. There is no guarding.  Musculoskeletal: Normal range of motion. She exhibits no edema and no tenderness.  Neurological: She is alert and oriented to person, place, and time. She has normal strength. She exhibits normal muscle tone.  Skin: Skin is warm and dry.  Psychiatric: Her behavior is normal. Judgment and thought content normal.  Appears depressed    ED Course  Procedures (including critical care time) Medications  0.9 %  sodium chloride infusion ( Intravenous New Bag/Given 08/27/12 1954)  acetaminophen (TYLENOL) tablet 650 mg (650 mg Oral Given 08/27/12 1955)  levETIRAcetam (KEPPRA) 1,000 mg in sodium chloride 0.9 % 100 mL IVPB (0 mg Intravenous Stopped 08/27/12 2014)    Patient Vitals for the past 24 hrs:  BP Temp Temp src Pulse Resp SpO2  08/27/12 1945 119/88 mmHg - - 83 16 100 %  08/27/12 1915 127/81 mmHg - - 77 11 98 %  08/27/12 1845 117/83 mmHg - - 75 14 99 %  08/27/12 1815 117/80 mmHg - - 73 11 97 %  08/27/12 1745 117/86 mmHg - - 75 13 99 %  08/27/12 1702 129/84 mmHg 97.5 F (36.4 C) Oral 100 18 95 %    9:15 PM Reevaluation with  update and discussion. After initial assessment and treatment, an updated evaluation reveals no seizure in ED, no further complaints. Tyresse Jayson L   5:41 PM-Consult complete with Dr. Cyril Mourning, Neurohospitalist. Patient case explained and discussed. He recommends starting Keppra as prophylaxis against seizures, and will see the patient, as needed. Call ended at 1745  9:15 PM-Consult complete with Admitting resident. Patient case explained and discussed. She agrees to admit patient for further evaluation and treatment. Call ended at 1941    Labs Reviewed  CBC WITH DIFFERENTIAL - Abnormal; Notable for the following:    RBC 3.47 (*)    Hemoglobin 10.5 (*)    HCT 32.0 (*)    All other components within normal limits  BASIC METABOLIC PANEL - Abnormal; Notable for the following:    Potassium 2.8 (*)    Glucose, Bld 63 (*)    BUN 5 (*)    Calcium 6.4 (*)    All other components within normal limits  URINALYSIS, ROUTINE W REFLEX MICROSCOPIC - Abnormal; Notable for the following:    APPearance CLOUDY (*)    Leukocytes, UA SMALL (*)    All other components within normal limits  URINE RAPID DRUG SCREEN (HOSP PERFORMED) - Abnormal; Notable for the following:    Opiates POSITIVE (*)    Benzodiazepines POSITIVE (*)    All other components within normal limits  URINE MICROSCOPIC-ADD ON - Abnormal; Notable for the following:    Squamous Epithelial / LPF MANY (*)    All other components within normal limits  ETHANOL  PREGNANCY, URINE   Ct Head Wo Contrast  08/27/2012   *RADIOLOGY REPORT*  Clinical Data: 22 year old female with seizure.  Right head injury.  CT HEAD WITHOUT CONTRAST  Technique:  Contiguous axial images were obtained from the base of the skull through the vertex without contrast.  Comparison: 12/30/2011.  Findings: Visualized orbit soft tissues are within normal limits. Broad-based right vertex scalp hematoma measuring up to 15 mm in thickness.  Underlying calvarium intact.  Other  scalp soft tissues are within normal limits.  Visualized paranasal sinuses and mastoids are clear.  Normal cerebral volume. No midline shift, ventriculomegaly, mass effect, evidence of mass lesion, intracranial hemorrhage or evidence of cortically based acute infarction.  Gray-white matter differentiation is within normal limits throughout the brain. Scattered small dural calcifications. No suspicious intracranial vascular hyperdensity.  IMPRESSION: 1.  Scalp hematoma without underlying fracture. 2.  Stable and normal noncontrast CT appearance of the brain.   Original Report Authenticated By: Erskine Speed, M.D.   1. Seizure   2. Substance abuse     MDM  Recurrent seizures, associated with sudden cessation of benzodiazepines. She may continue to have seizures. She will need medical admission before initiation of psychiatric care. Incidental hypokalemia. Will likely improve with better nutrition. I suspect polysubstance abuse since opiates  are positive, on urine drug screen.     Plan: Admit  Flint Melter, MD 08/27/12 2141  Flint Melter, MD 09/09/12 1005  Flint Melter, MD 09/09/12 [phone]

## 2012-08-27 NOTE — ED Notes (Signed)
Pt with hx of seizure x 1 year ago had seizure from xanax withdraw; pt sts out of xanax x 2 days and had witnessed seizure last night and again today; pt alert but lethargic at present; pt admits to xanax misuse

## 2012-08-27 NOTE — H&P (Signed)
Date: 08/27/2012               Patient Name:  DARNICE COMRIE MRN: [id number]  DOB: 08-Aug-1990 Age / Sex: 22 y.o., female   PCP: Corwin Levins, MD         Medical Service: Internal Medicine Teaching Service         Attending Physician: Dr. Jonah Blue, DO    First Contact: Dr. Mariea Clonts Pager: [phone]  Second Contact: Dr. Everardo Beals Pager: [phone]       After Hours (After 5p/  First Contact Pager: [phone]  weekends / holidays): Second Contact Pager: [phone]   Chief Complaint: 2 witnessed seizures  History of Present Illness:  Ms. KADIJA CRUZEN is a 22 y.o. y/o female w/ PMHx of anxiety, depression, and asthma, presents to the ED after 2 recent witnessed seizures starting last night. Her first seizure was last night, she was sleeping in a recliner chair in the living room when her father said she gasped for air and became rigid and shaking, for a couple minutes or so. She had her second episode at around 4:30 PM today when she fell to the ground with similar symptoms, and hit her head on the concrete, resulting in a contusion w/ mild abrasion. She does not recall the events and denies any voiding of her bladder and bowels, no tongue biting or vomiting. She claims to be extremely fatigued after the episodes. She claims to have had 5 previous seizures before, all of which were similar in presentation. She has a history of Xanax abuse, which she takes for her anxiety, but now takes 4 mg a day. When she runs out of her Xanax, she has withdrawal seizures, according to the patient and her mother. Her last Xanax that she took was yesterday morning, when she only took 0.5 mg.  Patient also found to be +ve for opioids on UDS. She admits to taking one of her mother's Vicodin on Friday night. Patient has no other complaints at this time. Denies chest pain, SOB, fever, chills, motor or sensory abnormalities, diarrhea, constipation or abdominal pain. Her LMP was 2 months ago, but has always had a history of  being very irregular. Denies the possibility of pregnancy.  Mother was present in the room at time of admission as well and provided information for HPI.   Meds: Current Facility-Administered Medications  Medication Dose Route Frequency Provider Last Rate Last Dose  . 0.9 %  sodium chloride infusion   Intravenous Continuous Flint Melter, MD 125 mL/hr at 08/27/12 1954     Current Outpatient Prescriptions  Medication Sig Dispense Refill  . ALPRAZolam (XANAX) 0.5 MG tablet Take 0.5 mg by mouth every 6 (six) hours as needed for anxiety.        Allergies: Allergies as of 08/27/2012  . (No Known Allergies)   Past Medical History  Diagnosis Date  . Asthma   . Depression   . Anxiety   . Headache(784.0)    History reviewed. No pertinent past surgical history. History reviewed. No pertinent family history. History   Social History  . Marital Status: Single    Spouse Name: N/A    Number of Children: N/A  . Years of Education: N/A   Occupational History  . Not on file.   Social History Main Topics  . Smoking status: Current Some Day Smoker -- 0.25 packs/day    Types: Cigarettes  . Smokeless tobacco: Never Used  . Alcohol Use: No  .  Drug Use: Yes     Comment: heroine  . Sexually Active: Yes   Other Topics Concern  . Not on file   Social History Narrative  . No narrative on file   Review of Systems: General: Has significant fatigue. Denies fever, chills, diaphoresis, appetite change.  HEENT: Denies change in vision, eye pain, redness, hearing loss, congestion, sore throat, rhinorrhea, sneezing, mouth sores, trouble swallowing, neck pain, neck stiffness and tinnitus.   Respiratory: Denies SOB, DOE, cough, chest tightness, and wheezing.   Cardiovascular: Denies chest pain, palpitations and leg swelling.  Gastrointestinal: Denies nausea, vomiting, abdominal pain, diarrhea, constipation, blood in stool and abdominal distention.  Genitourinary: Denies dysuria, urgency,  frequency, hematuria, flank pain and difficulty urinating.  Endocrine: Denies hot or cold intolerance, sweats, polyuria, polydipsia. Musculoskeletal: Denies myalgias, back pain, joint swelling, arthralgias and gait problem.  Skin: Denies pallor, rash and wounds.  Neurological: 5 previous seizures in the past, headache.   Denies dizziness, syncope, weakness, lightheadedness, numbness.   Hematological: Denies adenopathy,easy bruising, personal or family bleeding history.  Psychiatric/Behavioral: Has a history of anxiety and depression.  Physical Exam: Blood pressure 126/86, pulse 75, temperature 97.5 F (36.4 C), temperature source Oral, resp. rate 16, last menstrual period 06/27/2012, SpO2 98.00%. Filed Vitals:   08/27/12 1845 08/27/12 1915 08/27/12 1945 08/27/12 2330  BP: 117/83 127/81 119/88 126/86  Pulse: 75 77 83 75  Temp:      TempSrc:      Resp: 14 11 16 16   SpO2: 99% 98% 100% 98%   General: Vital signs reviewed.  Patient is a well-developed and well-nourished, in no acute distress and cooperative with exam. Alert and oriented x3.  Head: Normocephalic with contusion/abrasion on upper/posterior part of skull. Nose: No erythema or drainage noted.  Turbinates normal. Mouth: No erythema, exudates, sores, or ulcerations. Moist mucus membranes. Eyes: PERRL, EOMI, conjunctivae normal, No scleral icterus.  Neck: Supple, trachea midline, normal ROM, No JVD, masses, thyromegaly, or carotid bruit present.  Cardiovascular: RRR, S1 normal, S2 normal, no murmurs, gallops, or rubs. Pulmonary/Chest: normal respiratory effort, CTAB, no wheezes, rales, or rhonchi. Abdominal: Soft. Non-tender, non-distended, bowel sounds are normal, no masses, organomegaly, or guarding present.  GU: No CVA tenderness. Musculoskeletal: No joint deformities, erythema, or stiffness, ROM full and no nontender. Extremities: No swelling or edema,  pulses symmetric and intact bilaterally. No cyanosis or  clubbing. Hematology: no cervical, inginal, or axillary adenopathy.  Neurological: A&O x3, Strength is normal and symmetric bilaterally, cranial nerve II-XII are grossly intact, no focal motor deficit, sensory intact to light touch bilaterally.  Skin: Warm, dry and intact. No rashes or erythema. Psychiatric: Normal mood w/ dull affect. speech and behavior is normal. Cognition and memory are normal.   Lab results: Basic Metabolic Panel:  Recent Labs  09/81/19 1820  NA 138  K 2.8*  CL 110  CO2 23  GLUCOSE 63*  BUN 5*  CREATININE 0.52  CALCIUM 6.4*   CBC:  Recent Labs  08/27/12 1820  WBC 9.1  NEUTROABS 6.9  HGB 10.5*  HCT 32.0*  MCV 92.2  PLT 245   Urine Drug Screen: Drugs of Abuse     Component Value Date/Time   LABOPIA POSITIVE* 08/27/2012 1932   COCAINSCRNUR NONE DETECTED 08/27/2012 1932   LABBENZ POSITIVE* 08/27/2012 1932   AMPHETMU NONE DETECTED 08/27/2012 1932   THCU NONE DETECTED 08/27/2012 1932   LABBARB NONE DETECTED 08/27/2012 1932    Alcohol Level:  Recent Labs  08/27/12 1820  ETH <  11   Urinalysis:  Recent Labs  08/27/12 1932  COLORURINE YELLOW  LABSPEC 1.011  PHURINE 7.0  GLUCOSEU NEGATIVE  HGBUR NEGATIVE  BILIRUBINUR NEGATIVE  KETONESUR NEGATIVE  PROTEINUR NEGATIVE  UROBILINOGEN 0.2  NITRITE NEGATIVE  LEUKOCYTESUR SMALL*   Imaging results:  Ct Head Wo Contrast  08/27/2012   *RADIOLOGY REPORT*  Clinical Data: 22 year old female with seizure.  Right head injury.  CT HEAD WITHOUT CONTRAST  Technique:  Contiguous axial images were obtained from the base of the skull through the vertex without contrast.  Comparison: 12/30/2011.  Findings: Visualized orbit soft tissues are within normal limits. Broad-based right vertex scalp hematoma measuring up to 15 mm in thickness.  Underlying calvarium intact.  Other scalp soft tissues are within normal limits.  Visualized paranasal sinuses and mastoids are clear.  Normal cerebral volume. No midline shift,  ventriculomegaly, mass effect, evidence of mass lesion, intracranial hemorrhage or evidence of cortically based acute infarction.  Gray-white matter differentiation is within normal limits throughout the brain. Scattered small dural calcifications. No suspicious intracranial vascular hyperdensity.  IMPRESSION: 1.  Scalp hematoma without underlying fracture. 2.  Stable and normal noncontrast CT appearance of the brain.   Original Report Authenticated By: Erskine Speed, M.D.   Assessment & Plan by Problem: Ms. Berrian is a 23 y/o female w/ PMHx of anxiety, depression, and asthma, admitted for 2 witnessed tonic-clonic seizures likely secondary to benzodiazepine withdrawal.  History of similar admissions.   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 Her last Xanax was 0.5 mg yesterday morning and her normal daily dosage is 2 mg daily; however, she admits to taking more than her prescribed dose, up to 4mg  qd.  Given loading dose of Keppra 1000 mg IVPB for seizure prophylaxis per neuro in ED. -Discussed case w/ neurology over the phone, will continue home dose Xanax at this time.  But consider transitioning to long acting benzo vs. Gradual tapering of current xanax.  -Consider EEG, although patient denies h/o seizure disorder and has had recent hospitalizations secondary to benzo w/drawal as likely etiology -Frequent neuro checks  Scalp hematoma on CT head on admission. Secondary to fall s/p tonic clonic seizures.  Complains of mild headache.   -Give Tylenol 650 mg PRN pain -Dry dressing  Hypokalemia, found to be 2.8 in the ED.  -Repeat BMET and mag. If still low, replete as necessary.  Hypocalcemia d/t possible hypoalbuminemia, hormone disregulation, or severe Vitamin D deficiency. This could also be a contributing factor to her recent seizures. -PTH, Vit D, CMET, and ionized Calcium,  phos, and mag ordered.  Polysubstance abuse; found to be +ve for opioids and benzos on UDS on admission. She admits to taking her mother's Vicodin on Friday evening. Also discussed thoughts of self harm and patient denied. -Consider psych consult vs close follow up as outpatient -Will need further counseling for substance abuse.  Diet: Regular  DVT prophylaxis: Lovenox  Dispo: Disposition is deferred at this time, awaiting improvement of current medical problems. Anticipated discharge in approximately 1-2 day(s).   The patient does have a current PCP Corwin Levins, MD) and does not need an [facility] hospital follow-up appointment after discharge.  The patient does not have transportation limitations that hinder transportation to clinic appointments.  Signed: Lars Masson, MD 08/27/2012, 11:38 PM

## 2012-08-28 ENCOUNTER — Encounter (HOSPITAL_COMMUNITY): Payer: Self-pay

## 2012-08-28 DIAGNOSIS — R569 Unspecified convulsions: Secondary | ICD-10-CM

## 2012-08-28 LAB — COMPREHENSIVE METABOLIC PANEL
AST: 14 U/L (ref 0–37)
Albumin: 3.2 g/dL — ABNORMAL LOW (ref 3.5–5.2)
Calcium: 8.9 mg/dL (ref 8.4–10.5)
Chloride: 101 mEq/L (ref 96–112)
Creatinine, Ser: 0.58 mg/dL (ref 0.50–1.10)
Sodium: 135 mEq/L (ref 135–145)

## 2012-08-28 LAB — CBC
MCV: 91.9 fL (ref 78.0–100.0)
Platelets: 245 10*3/uL (ref 150–400)
RDW: 13.1 % (ref 11.5–15.5)
WBC: 9.3 10*3/uL (ref 4.0–10.5)

## 2012-08-28 MED ORDER — ACETAMINOPHEN 325 MG PO TABS
650.0000 mg | ORAL_TABLET | Freq: Four times a day (QID) | ORAL | Status: DC | PRN
  Administered 2012-08-28 – 2012-08-29 (×2): 650 mg via ORAL
  Filled 2012-08-28 (×3): qty 2

## 2012-08-28 MED ORDER — ALPRAZOLAM 0.5 MG PO TABS
0.5000 mg | ORAL_TABLET | ORAL | Status: AC
  Administered 2012-08-28: 0.5 mg via ORAL
  Filled 2012-08-28: qty 1

## 2012-08-28 MED ORDER — ONDANSETRON HCL 4 MG/2ML IJ SOLN: 4.0000 mg | Freq: Four times a day (QID) | INTRAMUSCULAR | Status: DC | PRN

## 2012-08-28 MED ORDER — PNEUMOCOCCAL VAC POLYVALENT 25 MCG/0.5ML IJ INJ
0.5000 mL | INJECTION | INTRAMUSCULAR | Status: AC
  Administered 2012-08-28: 0.5 mL via INTRAMUSCULAR
  Filled 2012-08-28: qty 0.5

## 2012-08-28 MED ORDER — ENOXAPARIN SODIUM 40 MG/0.4ML ~~LOC~~ SOLN
40.0000 mg | SUBCUTANEOUS | Status: DC
  Administered 2012-08-29: 40 mg via SUBCUTANEOUS
  Filled 2012-08-28 (×3): qty 0.4

## 2012-08-28 MED ORDER — ACETAMINOPHEN 650 MG RE SUPP: 650.0000 mg | Freq: Four times a day (QID) | RECTAL | Status: DC | PRN

## 2012-08-28 MED ORDER — ALPRAZOLAM 0.5 MG PO TABS
0.5000 mg | ORAL_TABLET | Freq: Four times a day (QID) | ORAL | Status: DC
  Administered 2012-08-28 (×2): 0.5 mg via ORAL
  Filled 2012-08-28 (×2): qty 1

## 2012-08-28 MED ORDER — ONDANSETRON HCL 4 MG PO TABS: 4.0000 mg | ORAL_TABLET | Freq: Four times a day (QID) | ORAL | Status: DC | PRN

## 2012-08-28 MED ORDER — CLONAZEPAM 0.5 MG PO TABS
0.5000 mg | ORAL_TABLET | Freq: Three times a day (TID) | ORAL | Status: DC
  Administered 2012-08-28 – 2012-08-30 (×5): 0.5 mg via ORAL
  Filled 2012-08-28 (×6): qty 1

## 2012-08-28 NOTE — Consult Note (Addendum)
NEURO HOSPITALIST CONSULT NOTE    Reason for Consult:seizures  HPI:                                                                                                                                          Shelby Branch is an 22 y.o. female with a past medical history significant for depression, anxiety, asthma, episodic migraine, and recurrent seizures with a semiology that I am going to describe below. According to her mother, Shelby Branch had " multiple seizures when she was about 13 months ago" and she is absolutely positive that they were not febrile seizures " but some type of virus that caused the seizures". Did not have further paroxysmal events concerning for seizures until approximately 2 years ago when she started using xanax for her mood disorder. Mother said that she is aware of 5 seizures total, all of then generalized convulsions that last for 1-2 minutes and afterwards she is confused, disoriented, and amnestic for the episode. Denies having any warning before the seizure, " they just happen within 24 hours of stopping using xanax". Never had seizures during sleep. No admission to the hospital on status epilepticus. Denies having other type of spells. Regarding her seizures yesterday, she indicated that " it is the same thing that always happen, I did nit have the xanax and then had seizures". She injured her occiput with these seizures yesterday. Received I gram IV keppra in the ED. No further seizures. They report no history of febrile seizures but " many seizures probably due to a virus". No CNS infection, severe head injury, or stroke. She was born full term, product of a normal pregnancy and delivery, no neonatal/perinatal complications. Normal development. No family history of epilepsy. CT brain in the ED showed no acute intracranial abnormality. Presently, denies headache, vertigo, double vision, unsteadiness, confusion, focal weakness or numbness, slurred  speech, language or vision impairment.     Past Medical History  Diagnosis Date  . Asthma   . Depression   . Anxiety   . Headache(784.0)     History reviewed. No pertinent past surgical history.  History reviewed. No pertinent family history.  Family History: no epilepsy.   Social History:  reports that she has been smoking Cigarettes.  She has been smoking about 0.25 packs per day. She has never used smokeless tobacco. She reports that she uses illicit drugs. She reports that she does not drink alcohol.  No Known Allergies  MEDICATIONS:  I have reviewed the patient's current medications.   ROS:                                                                                                                                       History obtained from the patient, mother, and chart review.  General ROS: negative for - chills, fatigue, fever, night sweats, weight gain or weight loss Psychological ROS: negative for - behavioral disorder, hallucinations, memory difficulties, or suicidal ideation Ophthalmic ROS: negative for - blurry vision, double vision, eye pain or loss of vision ENT ROS: negative for - epistaxis, nasal discharge, oral lesions, sore throat, tinnitus or vertigo Allergy and Immunology ROS: negative for - hives or itchy/watery eyes Hematological and Lymphatic ROS: negative for - bleeding problems, bruising or swollen lymph nodes Endocrine ROS: negative for - galactorrhea, hair pattern changes, polydipsia/polyuria or temperature intolerance Respiratory ROS: negative for - cough, hemoptysis, shortness of breath or wheezing Cardiovascular ROS: negative for - chest pain, dyspnea on exertion, edema or irregular heartbeat Gastrointestinal ROS: negative for - abdominal pain, diarrhea, hematemesis, nausea/vomiting or stool incontinence Genito-Urinary ROS:  negative for - dysuria, hematuria, incontinence or urinary frequency/urgency Musculoskeletal ROS: negative for - joint swelling or muscular weakness Neurological ROS: as noted in HPI Dermatological ROS: negative for rash and skin lesion changes    Physical exam: pleasant female in no apparent distress. Blood pressure 118/68, pulse 80, temperature 98.3 F (36.8 C), temperature source Oral, resp. rate 18, height 5\' 4"  (1.626 m), weight 61.145 kg (134 lb 12.8 oz), last menstrual period 06/27/2012, SpO2 100.00%. Head: normocephalic. Neck: supple, no bruits, no JVD. Cardiac: no murmurs. Lungs: clear. Abdomen: soft, no tender, no mass. Extremities: no edema.  Neurologic Examination:                                                                                                      Mental Status: Alert, awake, oriented x 4, thought content appropriate. Comprehension, naming, and repetition intact. Speech fluent without evidence of aphasia.  Able to follow 3 step commands without difficulty. Cranial Nerves: II: Discs flat bilaterally; Visual fields grossly normal, pupils equal, round, reactive to light and accommodation III,IV, VI: ptosis not present, extra-ocular motions intact bilaterally V,VII: smile symmetric, facial light touch sensation normal bilaterally VIII: hearing normal bilaterally IX,X: gag reflex present XI: bilateral shoulder shrug XII: midline tongue extension Motor: Right : Upper extremity   5/5    Left:     Upper extremity   5/5  Lower extremity   5/5     Lower extremity   5/5 Tone and bulk:normal tone throughout; no atrophy noted Sensory: Pinprick and light touch intact throughout, bilaterally Deep Tendon Reflexes:  2+ all over Plantars: Right: downgoing   Left: downgoing Cerebellar: normal finger-to-nose,  normal heel-to-shin test Gait: No ataxia. CV: pulses palpable throughout    Lab Results  Component Value Date/Time   CHOL 175 05/02/2008  1:54 PM     Results for orders placed during the hospital encounter of 08/27/12 (from the past 48 hour(s))  CBC WITH DIFFERENTIAL     Status: Abnormal   Collection Time    08/27/12  6:20 PM      Result Value Range   WBC 9.1  4.0 - 10.5 K/uL   RBC 3.47 (*) 3.87 - 5.11 MIL/uL   Hemoglobin 10.5 (*) 12.0 - 15.0 g/dL   HCT 45.4 (*) 09.8 - 11.9 %   MCV 92.2  78.0 - 100.0 fL   MCH 30.3  26.0 - 34.0 pg   MCHC 32.8  30.0 - 36.0 g/dL   RDW 14.7  82.9 - 56.2 %   Platelets 245  150 - 400 K/uL   Neutrophils Relative % 76  43 - 77 %   Neutro Abs 6.9  1.7 - 7.7 K/uL   Lymphocytes Relative 17  12 - 46 %   Lymphs Abs 1.5  0.7 - 4.0 K/uL   Monocytes Relative 7  3 - 12 %   Monocytes Absolute 0.6  0.1 - 1.0 K/uL   Eosinophils Relative 1  0 - 5 %   Eosinophils Absolute 0.1  0.0 - 0.7 K/uL   Basophils Relative 0  0 - 1 %   Basophils Absolute 0.0  0.0 - 0.1 K/uL  BASIC METABOLIC PANEL     Status: Abnormal   Collection Time    08/27/12  6:20 PM      Result Value Range   Sodium 138  135 - 145 mEq/L   Potassium 2.8 (*) 3.5 - 5.1 mEq/L   Chloride 110  96 - 112 mEq/L   CO2 23  19 - 32 mEq/L   Glucose, Bld 63 (*) 70 - 99 mg/dL   BUN 5 (*) 6 - 23 mg/dL   Creatinine, Ser 1.30  0.50 - 1.10 mg/dL   Calcium 6.4 (*) 8.4 - 10.5 mg/dL   Comment: CRITICAL RESULT CALLED TO, READ BACK BY AND VERIFIED WITH:     [facility] 1920 08/27/12 M.CAMPBELL   GFR calc non Af Amer >90  >90 mL/min   GFR calc Af Amer >90  >90 mL/min   Comment:            The eGFR has been calculated     using the CKD EPI equation.     This calculation has not been     validated in all clinical     situations.     eGFR's persistently     <90 mL/min signify     possible Chronic Kidney Disease.  ETHANOL     Status: None   Collection Time    08/27/12  6:20 PM      Result Value Range   Alcohol, Ethyl (B) <11  0 - 11 mg/dL   Comment:            LOWEST DETECTABLE LIMIT FOR     SERUM ALCOHOL IS 11 mg/dL     FOR MEDICAL PURPOSES ONLY   URINALYSIS, ROUTINE W REFLEX  MICROSCOPIC     Status: Abnormal   Collection Time    08/27/12  7:32 PM      Result Value Range   Color, Urine YELLOW  YELLOW   APPearance CLOUDY (*) CLEAR   Specific Gravity, Urine 1.011  1.005 - 1.030   pH 7.0  5.0 - 8.0   Glucose, UA NEGATIVE  NEGATIVE mg/dL   Hgb urine dipstick NEGATIVE  NEGATIVE   Bilirubin Urine NEGATIVE  NEGATIVE   Ketones, ur NEGATIVE  NEGATIVE mg/dL   Protein, ur NEGATIVE  NEGATIVE mg/dL   Urobilinogen, UA 0.2  0.0 - 1.0 mg/dL   Nitrite NEGATIVE  NEGATIVE   Leukocytes, UA SMALL (*) NEGATIVE  URINE RAPID DRUG SCREEN (HOSP PERFORMED)     Status: Abnormal   Collection Time    08/27/12  7:32 PM      Result Value Range   Opiates POSITIVE (*) NONE DETECTED   Cocaine NONE DETECTED  NONE DETECTED   Benzodiazepines POSITIVE (*) NONE DETECTED   Amphetamines NONE DETECTED  NONE DETECTED   Tetrahydrocannabinol NONE DETECTED  NONE DETECTED   Barbiturates NONE DETECTED  NONE DETECTED   Comment:            DRUG SCREEN FOR MEDICAL PURPOSES     ONLY.  IF CONFIRMATION IS NEEDED     FOR ANY PURPOSE, NOTIFY LAB     WITHIN 5 DAYS.                LOWEST DETECTABLE LIMITS     FOR URINE DRUG SCREEN     Drug Class       Cutoff (ng/mL)     Amphetamine      1000     Barbiturate      200     Benzodiazepine   200     Tricyclics       300     Opiates          300     Cocaine          300     THC              50  PREGNANCY, URINE     Status: None   Collection Time    08/27/12  7:32 PM      Result Value Range   Preg Test, Ur NEGATIVE  NEGATIVE   Comment:            THE SENSITIVITY OF THIS     METHODOLOGY IS >20 mIU/mL.  URINE MICROSCOPIC-ADD ON     Status: Abnormal   Collection Time    08/27/12  7:32 PM      Result Value Range   Squamous Epithelial / LPF MANY (*) RARE   WBC, UA 0-2  <3 WBC/hpf  MAGNESIUM     Status: None   Collection Time    08/28/12  2:42 AM      Result Value Range   Magnesium 1.7  1.5 - 2.5 mg/dL  CBC     Status:  Abnormal   Collection Time    08/28/12  2:42 AM      Result Value Range   WBC 9.3  4.0 - 10.5 K/uL   RBC 3.60 (*) 3.87 - 5.11 MIL/uL   Hemoglobin 11.0 (*) 12.0 - 15.0 g/dL   HCT 16.1 (*) 09.6 - 04.5 %   MCV 91.9  78.0 - 100.0 fL   MCH 30.6  26.0 - 34.0 pg  MCHC 33.2  30.0 - 36.0 g/dL   RDW 16.1  09.6 - 04.5 %   Platelets 245  150 - 400 K/uL  COMPREHENSIVE METABOLIC PANEL     Status: Abnormal   Collection Time    08/28/12  2:42 AM      Result Value Range   Sodium 135  135 - 145 mEq/L   Potassium 3.7  3.5 - 5.1 mEq/L   Comment: DELTA CHECK NOTED   Chloride 101  96 - 112 mEq/L   Comment: DELTA CHECK NOTED   CO2 26  19 - 32 mEq/L   Glucose, Bld 126 (*) 70 - 99 mg/dL   BUN 6  6 - 23 mg/dL   Creatinine, Ser 4.09  0.50 - 1.10 mg/dL   Calcium 8.9  8.4 - 81.1 mg/dL   Total Protein 6.3  6.0 - 8.3 g/dL   Albumin 3.2 (*) 3.5 - 5.2 g/dL   AST 14  0 - 37 U/L   ALT 5  0 - 35 U/L   Alkaline Phosphatase 71  39 - 117 U/L   Total Bilirubin 0.2 (*) 0.3 - 1.2 mg/dL   GFR calc non Af Amer >90  >90 mL/min   GFR calc Af Amer >90  >90 mL/min   Comment:            The eGFR has been calculated     using the CKD EPI equation.     This calculation has not been     validated in all clinical     situations.     eGFR's persistently     <90 mL/min signify     possible Chronic Kidney Disease.  CALCIUM, IONIZED     Status: Abnormal   Collection Time    08/28/12  3:00 AM      Result Value Range   Calcium, Ion 1.31 (*) 1.12 - 1.23 mmol/L    Ct Head Wo Contrast  08/27/2012   *RADIOLOGY REPORT*  Clinical Data: 22 year old female with seizure.  Right head injury.  CT HEAD WITHOUT CONTRAST  Technique:  Contiguous axial images were obtained from the base of the skull through the vertex without contrast.  Comparison: 12/30/2011.  Findings: Visualized orbit soft tissues are within normal limits. Broad-based right vertex scalp hematoma measuring up to 15 mm in thickness.  Underlying calvarium intact.  Other  scalp soft tissues are within normal limits.  Visualized paranasal sinuses and mastoids are clear.  Normal cerebral volume. No midline shift, ventriculomegaly, mass effect, evidence of mass lesion, intracranial hemorrhage or evidence of cortically based acute infarction.  Gray-white matter differentiation is within normal limits throughout the brain. Scattered small dural calcifications. No suspicious intracranial vascular hyperdensity.  IMPRESSION: 1.  Scalp hematoma without underlying fracture. 2.  Stable and normal noncontrast CT appearance of the brain.   Original Report Authenticated By: Erskine Speed, M.D.     Assessment/Plan: 22 years old without known risk factors for epilepsy and history of recurrent GTC seizures with the above described semiology. Normal neuro-exam and unremarkable CT brain. She has had recurrent GTC seizures as an adult that seem to be very consistent with provoked xanax withdrawal seizures but at the same time there is a history of several apparently unprovoked seizures at age 72 months old, and thus I believe is prudent to pursue seizure work. In this regard, will recommend: 1) MRI brain with and without contrast. 2) EEG. 3) No anticonvulsants for now.  Will follow up.  Georga Hacking  Leroy Kennedy, MD [facility] [phone]  08/28/2012, 3:36 PM

## 2012-08-28 NOTE — Progress Notes (Signed)
Subjective: No complaints today. No episodes of seizures since admission. Mother present during rounds. Tendern area on scalp where px hit her head during the seizure.  Objective: Vital signs in last 24 hours: Filed Vitals:   08/28/12 0015 08/28/12 0200 08/28/12 1000 08/28/12 1432  BP: 115/76 106/68 113/75 118/68  Pulse: 70 71 80   Temp: 98.1 F (36.7 C) 98.2 F (36.8 C) 98.4 F (36.9 C) 98.3 F (36.8 C)  TempSrc:   Oral Oral  Resp: 16 16 18 18   Height:      Weight:      SpO2: 100% 99% 100% 100%   Weight change:   Intake/Output Summary (Last 24 hours) at 08/28/12 1507 Last data filed at 08/28/12 1300  Gross per 24 hour  Intake    360 ml  Output      0 ml  Net    360 ml   General appearance: alert, cooperative, appears stated age and no distress Head: Normocephalic, ~3 by 3 cm hematoma of the scalp in the sup occipital region. Lungs: clear to auscultation bilaterally Heart: regular rate and rhythm, S1, S2 normal, no murmur, click, rub or gallop Abdomen: soft, non-tender; bowel sounds normal; no masses,  no organomegaly Extremities: extremities normal, atraumatic, no cyanosis or edema Lab Results: Basic Metabolic Panel:  Recent Labs Lab 08/27/12 1820 08/28/12 0242  NA 138 135  K 2.8* 3.7  CL 110 101  CO2 23 26  GLUCOSE 63* 126*  BUN 5* 6  CREATININE 0.52 0.58  CALCIUM 6.4* 8.9  MG  --  1.7   Liver Function Tests:  Recent Labs Lab 08/28/12 0242  AST 14  ALT 5  ALKPHOS 71  BILITOT 0.2*  PROT 6.3  ALBUMIN 3.2*   CBC:  Recent Labs Lab 08/27/12 1820 08/28/12 0242  WBC 9.1 9.3  NEUTROABS 6.9  --   HGB 10.5* 11.0*  HCT 32.0* 33.1*  MCV 92.2 91.9  PLT 245 245   Urine Drug Screen: Drugs of Abuse     Component Value Date/Time   LABOPIA POSITIVE* 08/27/2012 1932   COCAINSCRNUR NONE DETECTED 08/27/2012 1932   LABBENZ POSITIVE* 08/27/2012 1932   AMPHETMU NONE DETECTED 08/27/2012 1932   THCU NONE DETECTED 08/27/2012 1932   LABBARB NONE DETECTED  08/27/2012 1932    Alcohol Level:  Recent Labs Lab 08/27/12 1820  ETH <11   Urinalysis:  Recent Labs Lab 08/27/12 1932  COLORURINE YELLOW  LABSPEC 1.011  PHURINE 7.0  GLUCOSEU NEGATIVE  HGBUR NEGATIVE  BILIRUBINUR NEGATIVE  KETONESUR NEGATIVE  PROTEINUR NEGATIVE  UROBILINOGEN 0.2  NITRITE NEGATIVE  LEUKOCYTESUR SMALL*   Studies/Results: Ct Head Wo Contrast  08/27/2012   *RADIOLOGY REPORT*  Clinical Data: 22 year old female with seizure.  Right head injury.  CT HEAD WITHOUT CONTRAST  Technique:  Contiguous axial images were obtained from the base of the skull through the vertex without contrast.  Comparison: 12/30/2011.  Findings: Visualized orbit soft tissues are within normal limits. Broad-based right vertex scalp hematoma measuring up to 15 mm in thickness.  Underlying calvarium intact.  Other scalp soft tissues are within normal limits.  Visualized paranasal sinuses and mastoids are clear.  Normal cerebral volume. No midline shift, ventriculomegaly, mass effect, evidence of mass lesion, intracranial hemorrhage or evidence of cortically based acute infarction.  Gray-white matter differentiation is within normal limits throughout the brain. Scattered small dural calcifications. No suspicious intracranial vascular hyperdensity.  IMPRESSION: 1.  Scalp hematoma without underlying fracture. 2.  Stable and normal  noncontrast CT appearance of the brain.   Original Report Authenticated By: Erskine Speed, M.D.   Medications: I have reviewed the patient's current medications. Scheduled Meds: . clonazePAM  0.5 mg Oral TID  . enoxaparin (LOVENOX) injection  40 mg Subcutaneous Q24H  . pneumococcal 23 valent vaccine  0.5 mL Intramuscular Tomorrow-1000   Continuous Infusions:  PRN Meds:.acetaminophen, acetaminophen, ondansetron (ZOFRAN) IV, ondansetron Assessment/Plan:  Shelby Branch is a 22 y/o female w/ PMHx of anxiety, depression, and asthma, admitted for 2 witnessed tonic-clonic seizures  likely secondary to benzodiazepine withdrawal. History of similar admissions.   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 Her last Xanax was 0.5 mg yesterday morning and her normal daily dosage is 2 mg daily; however, she admits to taking more than her prescribed dose, up to 4mg  qd. Given loading dose of Keppra 1000 mg IVPB for seizure prophylaxis per neuro in ED.  -Will start klonopin- 0.5 mg PO -Will stop Xanax totally.  -Consider SSRI as more appropriate therapy for her anxiety and depression. -Neurology to consult, to R/o other factors that would decrease seizure threshold in this patient.  -Psych Consult. -Patient advised to stop driving as she could put her self and others at risk. # Scalp hematoma on CT head on admission. Secondary to fall s/p tonic clonic seizures. Complains of mild headache. Ct showed no evid of IC bleeding or fractures of the skull.. -Give Tylenol 650 mg PRN pain. # Polysubstance abuse; found to be +ve for opioids and benzos on UDS on admission. She admits to taking her mother's Vicodin on Friday evening. Also discussed thoughts of self harm and patient denied.  -Consider psych consult vs close follow up as outpatient.     Dispo: Disposition is deferred at this time, awaiting improvement of current medical problems.  Anticipated discharge in approximately 2 day(s).   The patient does have a current PCP Shelby Levins, MD) and does need an [facility] hospital follow-up appointment after discharge.  The patient does not have transportation limitations that hinder transportation to clinic appointments.  .Services Needed at time of discharge: Y = Yes, Blank = No PT:   OT:   RN:   Equipment:   Other:     LOS: 1 day   Shelby Carina, MD 08/28/2012, 3:07 PM

## 2012-08-28 NOTE — H&P (Signed)
INTERNAL MEDICINE TEACHING SERVICE Attending Admission Note  Date: 08/28/2012  Patient name: Shelby Branch  Medical record number: [id number]  Date of birth: [birth year]    I have seen and evaluated [name] and discussed their care with the Residency Team.  21 yr. Old WF with hx anxiety and depression, also reported asthma, presented due to 2 witnessed seizures. One of these seizures resulted in trauma to the occiput after falling on concrete. She was noted to have a scalp hematoma on CT without evidence of fracture. She admits she has been on xanax chronically for years due to anxiety disorder. She reports being on Lexapro for a short time and states this drug did not work (but reports low dose for about 3 months).  She reports she has BZD withdrawal seizures when she lowers her dose or runs out. Yesterday morning she only took 0.5 mg xanax and believes this caused her seizures. She is currently awake and alert x 4. Exam shows a hematoma in the superior occiput without laceration noted, mildly tender. She has FROM of neck without midline tenderness noted. She has no neurological deficits. At this time, her chronic use of xanax and recent bodily injury from withdrawal is concerning. Xanax should not be used as first line to treat chronic anxiety, especially with underlying depression. The patient's mother in the room (case discussed with her with patient's permission) states she believes the patient is addicted to xanax. At this time, she has been given a loading dose of Keppra. I would consult neurology and psychiatry for their input. She will need a longer acting BZD to be slowly weaned while she is placed on more appropriate therapy for anxiety and depression, such as an SSRI.  The question here would be if she needs to continue Keppra due to her many reported withdrawal seizures and recent bodily harm. I advised her that she is not to drive until further notice from her psychiatrist, as this  places her and the community at risk of injury. I would start her on Klonopin 0.5 mg PO tid and stop xanax, she will likely need up titration of this agent but it is longer acting.  Jonah Blue, DO 7/20/20141:11 PM

## 2012-08-29 ENCOUNTER — Observation Stay (HOSPITAL_COMMUNITY): Payer: 59

## 2012-08-29 LAB — PARATHYROID HORMONE, INTACT (NO CA): PTH: 47.8 pg/mL (ref 14.0–72.0)

## 2012-08-29 LAB — VITAMIN D 25 HYDROXY (VIT D DEFICIENCY, FRACTURES): Vit D, 25-Hydroxy: 38 ng/mL (ref 30–89)

## 2012-08-29 MED ORDER — GADOBENATE DIMEGLUMINE 529 MG/ML IV SOLN
15.0000 mL | Freq: Once | INTRAVENOUS | Status: AC
  Administered 2012-08-29: 13 mL via INTRAVENOUS

## 2012-08-29 NOTE — Progress Notes (Signed)
Routine adult EEG completed. 

## 2012-08-29 NOTE — Progress Notes (Signed)
Subjective: No complaints today. No seizures since admission. Missed a dose of clonazepam, as she went down for imaging.  Objective: Vital signs in last 24 hours: Filed Vitals:   08/29/12 0200 08/29/12 0600 08/29/12 1000 08/29/12 1401  BP: 105/57 108/69 114/68 126/70  Pulse: 61 67 69 93  Temp: 97.5 F (36.4 C) 97.9 F (36.6 C) 98 F (36.7 C) 98.2 F (36.8 C)  TempSrc:   Oral Oral  Resp: 18 69 18 18  Height:      Weight:      SpO2: 99% 98% 100% 100%   Weight change:   Intake/Output Summary (Last 24 hours) at 08/29/12 1552 Last data filed at 08/28/12 1900  Gross per 24 hour  Intake    360 ml  Output      0 ml  Net    360 ml   General appearance: alert, cooperative, appears stated age and no distress Lungs: clear to auscultation bilaterally Heart: regular rate and rhythm, S1, S2 normal, no murmur, click, rub or gallop Abdomen: soft, non-tender; bowel sounds normal; no masses,  no organomegaly Extremities: extremities normal, atraumatic, no cyanosis or edema Lab Results: Basic Metabolic Panel:  Recent Labs Lab 08/27/12 1820 08/28/12 0242  NA 138 135  K 2.8* 3.7  CL 110 101  CO2 23 26  GLUCOSE 63* 126*  BUN 5* 6  CREATININE 0.52 0.58  CALCIUM 6.4* 8.9  MG  --  1.7   Liver Function Tests:  Recent Labs Lab 08/28/12 0242  AST 14  ALT 5  ALKPHOS 71  BILITOT 0.2*  PROT 6.3  ALBUMIN 3.2*   CBC:  Recent Labs Lab 08/27/12 1820 08/28/12 0242  WBC 9.1 9.3  NEUTROABS 6.9  --   HGB 10.5* 11.0*  HCT 32.0* 33.1*  MCV 92.2 91.9  PLT 245 245   Urine Drug Screen: Drugs of Abuse     Component Value Date/Time   LABOPIA POSITIVE* 08/27/2012 1932   COCAINSCRNUR NONE DETECTED 08/27/2012 1932   LABBENZ POSITIVE* 08/27/2012 1932   AMPHETMU NONE DETECTED 08/27/2012 1932   THCU NONE DETECTED 08/27/2012 1932   LABBARB NONE DETECTED 08/27/2012 1932    Alcohol Level:  Recent Labs Lab 08/27/12 1820  ETH <11   Urinalysis:  Recent Labs Lab 08/27/12 1932    COLORURINE YELLOW  LABSPEC 1.011  PHURINE 7.0  GLUCOSEU NEGATIVE  HGBUR NEGATIVE  BILIRUBINUR NEGATIVE  KETONESUR NEGATIVE  PROTEINUR NEGATIVE  UROBILINOGEN 0.2  NITRITE NEGATIVE  LEUKOCYTESUR SMALL*   Micro Results: No results found for this or any previous visit (from the past 240 hour(s)). Studies/Results: Ct Head Wo Contrast  08/27/2012   *RADIOLOGY REPORT*  Clinical Data: 22 year old female with seizure.  Right head injury.  CT HEAD WITHOUT CONTRAST  Technique:  Contiguous axial images were obtained from the base of the skull through the vertex without contrast.  Comparison: 12/30/2011.  Findings: Visualized orbit soft tissues are within normal limits. Broad-based right vertex scalp hematoma measuring up to 15 mm in thickness.  Underlying calvarium intact.  Other scalp soft tissues are within normal limits.  Visualized paranasal sinuses and mastoids are clear.  Normal cerebral volume. No midline shift, ventriculomegaly, mass effect, evidence of mass lesion, intracranial hemorrhage or evidence of cortically based acute infarction.  Gray-white matter differentiation is within normal limits throughout the brain. Scattered small dural calcifications. No suspicious intracranial vascular hyperdensity.  IMPRESSION: 1.  Scalp hematoma without underlying fracture. 2.  Stable and normal noncontrast CT appearance of the  brain.   Original Report Authenticated By: Erskine Speed, M.D.   Mr [name] RU Contrast  08/29/2012   *RADIOLOGY REPORT*  Clinical Data: Seizures.  Trauma to the head.  MRI HEAD WITHOUT AND WITH CONTRAST  Technique:  Multiplanar, multiecho pulse sequences of the brain and surrounding structures were obtained according to standard protocol without and with intravenous contrast  Contrast: 13mL MULTIHANCE GADOBENATE DIMEGLUMINE 529 MG/ML IV SOLN  Comparison: Head CT 08/27/2012.  MRI 04/06/2008  Findings: The brain appears normally formed.  Diffusion imaging does not show any acute or  subacute infarction.  No evidence of mass lesion, hemorrhage, hydrocephalus or extra-axial collection. Mesial temporal lobes appear normal.  Scalp swelling with a focal hematoma in the right parietal vertex region again noted.  No evidence of underlying skull fracture.  After contrast administration, no abnormal enhancement occurs.  IMPRESSION: Right parietal scalp injury.  No visible abnormality of the brain. No underlying lesions seen to explain seizures.   Original Report Authenticated By: Paulina Fusi, M.D.   Medications: I have reviewed the patient's current medications. Scheduled Meds: . clonazePAM  0.5 mg Oral TID  . 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 Given loading dose of Keppra 1000 mg IVPB for seizure prophylaxis per neuro in ED, no seizure since admission.  -2nd day on klonopin- 0.5 mg PO  -Will stop Xanax totally.  -Consider SSRI as more appropriate therapy for her anxiety and depression.  -Neurology to consult,- Hx of prior seizures when patient was 31 mths old- not febrile seizures, Further investigations to R/o other factors that would decrease seizure threshold in this patient- MRI and EEG recommended. But MRI showed no abnormalities that will explain seizures, awaiting EEG results. -Patient advised to stop driving as she could put her self and others at risk. -Awaiting psych input.  # Scalp hematoma on CT head on admission. Secondary to fall s/p tonic clonic seizures. Complains of mild pain. Ct showed no evid of IC bleeding or fractures of the skull.  -Give Tylenol 650 mg PRN pain.  # Polysubstance abuse; found to be +ve for  opioids and benzos on UDS on admission. She admits to taking her mother's Vicodin on Friday evening. Also discussed thoughts of self harm and patient denied.  -Consider psych consult vs close follow up as outpatient  Dispo: Disposition is deferred at this time, awaiting improvement of current medical problems.  Anticipated discharge in approximately 1-2 day(s).   The patient does have a current PCP Corwin Levins, MD) and does need an [facility] hospital follow-up appointment after discharge.  The patient does not have transportation limitations that hinder transportation to clinic appointments.  .Services Needed at time of discharge: Y = Yes, Blank = No PT:   OT:   RN:   Equipment:   Other:     LOS: 2 days   Kennis Carina, MD 08/29/2012, 3:52 PM

## 2012-08-29 NOTE — Progress Notes (Signed)
NEURO HOSPITALIST PROGRESS NOTE   SUBJECTIVE:                                                                                                                        No further seizures.   OBJECTIVE:                                                                                                                           Vital signs in last 24 hours: Temp:  [97.5 F (36.4 C)-98.3 F (36.8 C)] 98 F (36.7 C) (07/21 1000) Pulse Rate:  [61-87] 69 (07/21 1000) Resp:  [18-69] 18 (07/21 1000) BP: (105-118)/(57-77) 114/68 mmHg (07/21 1000) SpO2:  [98 %-100 %] 100 % (07/21 1000)  Intake/Output from previous day: 07/20 0701 - 07/21 0700 In: 720 [P.O.:720] Out: -  Intake/Output this shift:   Nutritional status: General  Past Medical History  Diagnosis Date  . Asthma   . Depression   . Anxiety   . JYNWGNFA(213.0)      Neurologic Exam:  Mental Status: Alert, oriented, thought content appropriate.  Speech fluent without evidence of aphasia.  Able to follow 3 step commands without difficulty. Cranial Nerves: II: Discs flat bilaterally; Visual fields grossly normal, pupils equal, round, reactive to light and accommodation III,IV, VI: ptosis not present, extra-ocular motions intact bilaterally V,VII: smile symmetric, facial light touch sensation normal bilaterally VIII: hearing normal bilaterally IX,X: gag reflex present XI: bilateral shoulder shrug XII: midline tongue extension Motor: Right : Upper extremity   5/5    Left:     Upper extremity   5/5  Lower extremity   5/5     Lower extremity   5/5 Tone and bulk:normal tone throughout; no atrophy noted Sensory: Pinprick and light touch intact throughout, bilaterally Deep Tendon Reflexes:  Right: Upper Extremity   Left: Upper extremity   biceps (C-5 to C-6) 2/4   biceps (C-5 to C-6) 2/4 tricep (C7) 2/4    triceps (C7) 2/4 Brachioradialis (C6) 2/4  Brachioradialis (C6) 2/4  Lower Extremity  Lower Extremity  quadriceps (L-2 to L-4) 2/4   quadriceps (L-2 to L-4) 2/4 Achilles (S1) 2/4   Achilles (S1) 2/4  Plantars: Right: downgoing   Left: downgoing Cerebellar: normal  finger-to-nose,  normal heel-to-shin test CV: pulses palpable throughout    Lab Results: Lab Results  Component Value Date/Time   CHOL 175 05/02/2008  1:54 PM   Lipid Panel No results found for this basename: CHOL, TRIG, HDL, CHOLHDL, VLDL, LDLCALC,  in the last 72 hours  Studies/Results: Ct Head Wo Contrast  08/27/2012   *RADIOLOGY REPORT*  Clinical Data: 22 year old female with seizure.  Right head injury.  CT HEAD WITHOUT CONTRAST  Technique:  Contiguous axial images were obtained from the base of the skull through the vertex without contrast.  Comparison: 12/30/2011.  Findings: Visualized orbit soft tissues are within normal limits. Broad-based right vertex scalp hematoma measuring up to 15 mm in thickness.  Underlying calvarium intact.  Other scalp soft tissues are within normal limits.  Visualized paranasal sinuses and mastoids are clear.  Normal cerebral volume. No midline shift, ventriculomegaly, mass effect, evidence of mass lesion, intracranial hemorrhage or evidence of cortically based acute infarction.  Gray-white matter differentiation is within normal limits throughout the brain. Scattered small dural calcifications. No suspicious intracranial vascular hyperdensity.  IMPRESSION: 1.  Scalp hematoma without underlying fracture. 2.  Stable and normal noncontrast CT appearance of the brain.   Original Report Authenticated By: Erskine Speed, M.D.   Mr [name] WU Contrast  08/29/2012   *RADIOLOGY REPORT*  Clinical Data: Seizures.  Trauma to the head.  MRI HEAD WITHOUT AND WITH CONTRAST  Technique:  Multiplanar, multiecho pulse sequences of the brain and surrounding structures were obtained according to standard protocol without and with intravenous contrast  Contrast: 13mL MULTIHANCE GADOBENATE DIMEGLUMINE 529  MG/ML IV SOLN  Comparison: Head CT 08/27/2012.  MRI 04/06/2008  Findings: The brain appears normally formed.  Diffusion imaging does not show any acute or subacute infarction.  No evidence of mass lesion, hemorrhage, hydrocephalus or extra-axial collection. Mesial temporal lobes appear normal.  Scalp swelling with a focal hematoma in the right parietal vertex region again noted.  No evidence of underlying skull fracture.  After contrast administration, no abnormal enhancement occurs.  IMPRESSION: Right parietal scalp injury.  No visible abnormality of the brain. No underlying lesions seen to explain seizures.   Original Report Authenticated By: Paulina Fusi, M.D.    MEDICATIONS                                                                                                                        Scheduled: . clonazePAM  0.5 mg Oral TID  . enoxaparin (LOVENOX) injection  40 mg Subcutaneous Q24H    ASSESSMENT/PLAN:  22 years old without known risk factors for epilepsy and history of recurrent GTC seizures.  Patient has been on Xanax for quit a long time and has suffered multiple seizures in past all related to Xanax withdrawal. MRI brain showed no acute abnormality of focus for seizure activity.  EEG is pending but patient is currently back to baseline. Would not start AED at this time as seizure likely provoked by Xanax withdrawal.  EEG may be followed as a out patient as patient is currently back to baseline.    Recommend: 1) follow up with her psychiatrist. 2) agree with clonazepam however patient admitted to having some possible addictive behavior and may be better treated with non-benzodiazepine.   No further recommendations.  Neurology S/O        Assessment and plan discussed with with attending physician and they are in agreement.    Felicie Morn PA-C [facility] [phone]  08/29/2012, 1:42 PM   Patient seen and examined together with physician assistant and I concur with the assessment and plan.  Wyatt Portela, MD

## 2012-08-29 NOTE — Progress Notes (Signed)
  Date: 08/29/2012  Patient name: Shelby Branch  Medical record number: [id number]  Date of birth: 1990/09/24   This patient has been seen and the plan of care was discussed with the house staff. Please see their note for complete details. I concur with their findings with the following additions/corrections:  No further seizures. She has one missed dose of clonazepam due to imaging obtained and feels she was slightly more anxious at that time.  Otherwise, feels this medication is working for her. MRI w/o structural brain abnormalities to explain seizure activity.  Appreciate neuro input, they feel no need for AED's. I would use clonazepam and wean as outpatient with introduction of more appropriate treatment for anxiety/depression, such as an SSRI. Pending Psych input.  Jonah Blue, DO 08/29/2012, 2:07 PM

## 2012-08-29 NOTE — Progress Notes (Signed)
INITIAL NUTRITION ASSESSMENT  DOCUMENTATION CODES Per approved criteria  -Not Applicable   INTERVENTION: 1. Snacks BID   NUTRITION DIAGNOSIS: Inadequate oral intake related to decreased appetite as evidenced by pt report weight loss  Goal: PO intake to meet >/=90% estimated nutrition needs  Monitor:  PO intake, weight trends, labs  Reason for Assessment: Malnutrition Screening Tool  22 y.o. female  Admitting Dx: Seizure  ASSESSMENT: Pt admitted after having seizures in the setting of Xanax withdrawal.   RD pulled to chart for malnutrition screening tool report of unintentional weight loss and poor oral intake in the past several months. Pt states that she has been dealing with depression and had a poor appetite. Usually weighs about 145 lbs, but had gained some weight to about 160? Lbs and then lost to current weight of 134 lbs. Eats only 1-2 meals per day (sometimes none at all per mother). Talked with pt about small frequent meals and snacks. Pt had breakfast tray at bedside, untouched. Mother concerned that pt had not yet gotten her medications and "its now 30 minutes late" per her report.  Weight hx shows 11 lb weight loss in the past year.  Height: Ht Readings from Last 1 Encounters:  08/27/12 5\' 4"  (1.626 m) (45%*, Z = -0.11)   * Growth percentiles are based on CDC 2-20 Years data.    Weight: Wt Readings from Last 1 Encounters:  08/27/12 134 lb 12.8 oz (61.145 kg) (61%*, Z = 0.29)   * Growth percentiles are based on CDC 2-20 Years data.    Ideal Body Weight: 120 lbs   % Ideal Body Weight: 112%  Wt Readings from Last 10 Encounters:  08/27/12 134 lb 12.8 oz (61.145 kg) (61%*, Z = 0.29)  07/21/11 145 lb (65.772 kg) (78%*, Z = 0.77)  03/30/11 143 lb 15.4 oz (65.3 kg) (78%*, Z = 0.77)  08/22/10 139 lb 8 oz (63.277 kg) (75%*, Z = 0.67)  03/19/09 150 lb (68.04 kg) (87%*, Z = 1.11)  12/11/08 145 lb (65.772 kg) (84%*, Z = 0.99)  05/02/08 137 lb 4 oz (62.256 kg)  (79%*, Z = 0.81)   * Growth percentiles are based on CDC 2-20 Years data.    Usual Body Weight: 145 lbs   % Usual Body Weight: 92%  BMI:  Body mass index is 23.13 kg/(m^2). WNL   Estimated Nutritional Needs: Kcal: 1700-1900 Protein: 50-60 gm  Fluid: 1.7-1.9 L   Skin: intact   Diet Order: General  EDUCATION NEEDS: -No education needs identified at this time   Intake/Output Summary (Last 24 hours) at 08/29/12 1050 Last data filed at 08/28/12 1900  Gross per 24 hour  Intake    720 ml  Output      0 ml  Net    720 ml    Last BM: PTA  Labs:   Recent Labs Lab 08/27/12 1820 08/28/12 0242  NA 138 135  K 2.8* 3.7  CL 110 101  CO2 23 26  BUN 5* 6  CREATININE 0.52 0.58  CALCIUM 6.4* 8.9  MG  --  1.7  GLUCOSE 63* 126*    CBG (last 3)  No results found for this basename: GLUCAP,  in the last 72 hours  Scheduled Meds: . clonazePAM  0.5 mg Oral TID  . enoxaparin (LOVENOX) injection  40 mg Subcutaneous Q24H    Continuous Infusions:   Past Medical History  Diagnosis Date  . Asthma   . Depression   . Anxiety   .  Headache(784.0)     History reviewed. No pertinent past surgical history.  Clarene Duke RD, LDN Pager [phone] After Hours pager [phone]

## 2012-08-30 DIAGNOSIS — F411 Generalized anxiety disorder: Secondary | ICD-10-CM

## 2012-08-30 MED ORDER — CLONAZEPAM 0.5 MG PO TABS: 0.5000 mg | ORAL_TABLET | Freq: Three times a day (TID) | ORAL | Status: DC

## 2012-08-30 NOTE — Progress Notes (Signed)
Clinical Social Work Department CLINICAL SOCIAL WORK PSYCHIATRY SERVICE LINE ASSESSMENT 08/30/2012  Patient:  Shelby Branch  Account:  [id number]  Admit Date:  08/27/2012  Clinical Social Worker:  Leron Croak, CLINICAL SOCIAL WORKER  Date/Time:  08/30/2012 06:45 AM Referred by:  Physician  Date referred:  08/29/2012 Reason for Referral  Behavioral Health Issues   Presenting Symptoms/Problems (In the person's/family's own words):   Pt came with a drug Sz from withdrawal of  Xanax. Pt attempted to take herself off Xanax.   Abuse/Neglect/Trauma History (check all that apply)  Denies history   Abuse/Neglect/Trauma Comments:   Psychiatric History (check all that apply)  Outpatient treatment   Psychiatric medications:  . clonazePAM  0.5 mg Oral TID   Current Mental Health Hospitalizations/Previous Mental Health History:   Pt denies ever having spent time in a [facility] INPT Unit.   Current provider:   Place and Date:   Current Medications:   Previous Impatient Admission/Date/Reason:   Pt has been hospitalized before with the same complaint. Pt has experienced 5 Sz's to date.   Emotional Health / Current Symptoms    Suicide/Self Harm  None reported   Suicide attempt in the past:   None   Other harmful behavior:   None   Psychotic/Dissociative Symptoms  None reported   Other Psychotic/Dissociative Symptoms:   None    Attention/Behavioral Symptoms  Within Normal Limits   Other Attention / Behavioral Symptoms:    Cognitive Impairment  Within Normal Limits   Other Cognitive Impairment:    Mood and Adjustment  Anxious    Stress, Anxiety, Trauma, Any Recent Loss/Stressor  Anxiety   Anxiety (frequency):   Pt stated that she has always felt anxious but that it has been increasing. Pt stated that since her Sz's began her anxiety has increased for fear that she will continue having them.   Phobia (specify):   Compulsive behavior (specify):   Obsessive behavior  (specify):   Other:   Substance Abuse/Use  Current substance use   SBIRT completed (please refer for detailed history):  NA  Self-reported substance use:   Urinary Drug Screen Completed:  Y Alcohol level:   No ETOH at this time    Environmental/Housing/Living Arrangement  Stable housing   Who is in the home:   Pt stated that she lives with her Mom, Dad, and little brother.   Emergency contact:  [place]           Mother [phone]   Financial  Private Insurance   Patient's Strengths and Goals (patient's own words):   Pt is very willing to stop taking the medication and was very plesant ahnd rective during the assessment.   Clinical Social Worker's Interpretive Summary:   CSW met with the Pt at the bedside. CSW introduced self and stated the reason for the visit. Pt was eager to receive information about withdrawal Sx's and also about IOP/PHP programs. Pt stated that she will be starting a rehab program but that she does not know where that will be. CSW provided multiple resources to include anxiety information with coping Techniques, depression information, and a listing about IOP/PHP programs in the area.   Disposition:  Psych Clinical Social Worker signing off Leron Croak, LCSWA [facility] Emergency Dept.  [phone]

## 2012-08-30 NOTE — Progress Notes (Signed)
  Date: 08/30/2012  Patient name: Shelby Branch  Medical record number: [id number]  Date of birth: Jan 02, 1991   This patient has been seen and the plan of care was discussed with the house staff. Please see their note for complete details. I concur with their findings with the following additions/corrections:  No further seizure activity. Tolerating current treatment with Klonopin 0.5 mg PO tid. Her psychiatrist has been notified and she will be follow up in the next 2 weeks.  She will need slow taper due to BZD withdrawal seizures to a more appropriate anxiety treatment, such as an SSRI. Advised her no driving for at least 6 months. She will notify the [facility] herself. Discussed with her mother at bedside with patient's permission.  They both agree driving is a danger at this time. She will ultimately have to have her psychiatrist and outpatient neurology decide on when it is safe for her to drive again.  She will F/U with her PCP as well.  Medically stable for discharge.  Jonah Blue, DO 08/30/2012, 3:09 PM
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
## 2012-08-30 NOTE — Consult Note (Signed)
Reason for Consult:  Benzodiazepine dependency Referring Physician: Dr. Mosetta Putt is an 22 y.o. female.  HPI: Patient experienced two seizures on Friday prior to admission without her Xanax.  She has a history of seizures from 13 months to 8 years of age and currently has been abusing benzodiazepines for over a year.  Her medical team is trying to differentiate between baseline seizure disorder or benzodiazepine withdrawal seizures.  No seizures during hospitalization and Xanax changed to Klonopin (longer drug duration).  Dr. Lucianne Muss interviewed this patient and her mother (patient consented) and collaborated with the plan below and to continue seeing her regular Psychiatrist Dr. Madaline Guthrie with the understanding of tapering her Klonopin.  Past Medical History  Diagnosis Date  . Asthma   . Depression   . Anxiety   . Headache(784.0)     History reviewed. No pertinent past surgical history.  History reviewed. No pertinent family history.  Social History:  reports that she has been smoking Cigarettes.  She has been smoking about 0.25 packs per day. She has never used smokeless tobacco. She reports that she uses illicit drugs. She reports that she does not drink alcohol.  Allergies: No Known Allergies  Medications: I have reviewed the patient's current medications.  No results found for this or any previous visit (from the past 48 hour(s)).  Mr [name] OZ Contrast  08/29/2012   *RADIOLOGY REPORT*  Clinical Data: Seizures.  Trauma to the head.  MRI HEAD WITHOUT AND WITH CONTRAST  Technique:  Multiplanar, multiecho pulse sequences of the brain and surrounding structures were obtained according to standard protocol without and with intravenous contrast  Contrast: 13mL MULTIHANCE GADOBENATE DIMEGLUMINE 529 MG/ML IV SOLN  Comparison: Head CT 08/27/2012.  MRI 04/06/2008  Findings: The brain appears normally formed.  Diffusion imaging does not show any acute or subacute infarction.  No evidence  of mass lesion, hemorrhage, hydrocephalus or extra-axial collection. Mesial temporal lobes appear normal.  Scalp swelling with a focal hematoma in the right parietal vertex region again noted.  No evidence of underlying skull fracture.  After contrast administration, no abnormal enhancement occurs.  IMPRESSION: Right parietal scalp injury.  No visible abnormality of the brain. No underlying lesions seen to explain seizures.   Original Report Authenticated By: Paulina Fusi, M.D.    Review of Systems  Constitutional: Negative.   HENT: Negative.   Eyes: Negative.   Respiratory: Negative.   Cardiovascular: Negative.   Gastrointestinal: Negative.   Genitourinary: Negative.   Musculoskeletal: Negative.   Skin: Negative.   Neurological: Negative.   Endo/Heme/Allergies: Negative.   Psychiatric/Behavioral: Positive for substance abuse. The patient is nervous/anxious.    Blood pressure 98/55, pulse 80, temperature 97.6 F (36.4 C), temperature source Oral, resp. rate 16, height 5\' 4"  (1.626 m), weight 61.145 kg (134 lb 12.8 oz), last menstrual period 06/27/2012, SpO2 98.00%. Physical Exam Completed by primary MD, reviewed  Family History:  No family history on file, patient does have a cousin in [place].  Assessment/Plan:   Mental Status Examination/Evaluation: Patient is disheveled and sitting in her bed watching television, eye contact fair, answers questions appropriately. She is calm and cooperative today, denies any active or passive suicidal thoughts or homicidal thoughts.  Her thoughts are organized and denies depression.  There is no paranoia or delusions present at this time. She denies any auditory or visual hallucination. Her attention and concentration is fair.  Her insight regarding benzodiazepine abuse is poor and judgment is fair.  DIAGNOSIS:  AXIS I  General Anxiety Disorder  AXIS II  Deferred   AXIS III  Seizure history, asthma  AXIS IV  other psychosocial or environmental  problems, unemployment, problems with access to health care services and problems with primary support group   AXIS V  61-70 mild symptoms    Assessment/Plan:  Recommend plan to switch to Klonopin and to taper the patient off medication over a 2-3 week period to prevent withdrawal seizures, add Buspar 10 mg TID for anxiety or Vistaril 25 mg every 6 hours PRN anxiety. Patient does not need inpatient psychiatric treatment. Recommend followup treatment with Dr Madaline Guth[facility] at [facility] and attend NA support groups. Contact Child psychotherapist for outpatient discharge planning.  Nanine Means 08/29/2012, 7:46 AM

## 2012-08-30 NOTE — Progress Notes (Signed)
Subjective: No complaints today. Mother at bed side. No seizures since admission. Feels the clonipine she has been taking while on admission has adequately controlled her anxiety.  Objective: Vital signs in last 24 hours: Filed Vitals:   08/29/12 2200 08/30/12 0200 08/30/12 0600 08/30/12 0948  BP: 117/74 98/55 98/49  115/70  Pulse: 89 80 76 100  Temp: 97.6 F (36.4 C) 97.6 F (36.4 C) 97.7 F (36.5 C) 97.8 F (36.6 C)  TempSrc:    Oral  Resp: 16 16 16 16   Height:      Weight:      SpO2: 100% 98% 96% 100%   Weight change:  No intake or output data in the 24 hours ending 08/30/12 1153 General appearance: alert, cooperative and appears stated age Lungs: clear to auscultation bilaterally Heart: regular rate and rhythm, S1, S2 normal, no murmur, click, rub or gallop Abdomen: soft, non-tender; bowel sounds normal; no masses,  no organomegaly. Extremities: extremities normal, atraumatic, no cyanosis or edema Pulses: 2+ and symmetric Lab Results: Basic Metabolic Panel:  Recent Labs Lab 08/27/12 1820 08/28/12 0242  NA 138 135  K 2.8* 3.7  CL 110 101  CO2 23 26  GLUCOSE 63* 126*  BUN 5* 6  CREATININE 0.52 0.58  CALCIUM 6.4* 8.9  MG  --  1.7   Liver Function Tests:  Recent Labs Lab 08/28/12 0242  AST 14  ALT 5  ALKPHOS 71  BILITOT 0.2*  PROT 6.3  ALBUMIN 3.2*   CBC:  Recent Labs Lab 08/27/12 1820 08/28/12 0242  WBC 9.1 9.3  NEUTROABS 6.9  --   HGB 10.5* 11.0*  HCT 32.0* 33.1*  MCV 92.2 91.9  PLT 245 245   Urine Drug Screen: Drugs of Abuse     Component Value Date/Time   LABOPIA POSITIVE* 08/27/2012 1932   COCAINSCRNUR NONE DETECTED 08/27/2012 1932   LABBENZ POSITIVE* 08/27/2012 1932   AMPHETMU NONE DETECTED 08/27/2012 1932   THCU NONE DETECTED 08/27/2012 1932   LABBARB NONE DETECTED 08/27/2012 1932    Alcohol Level:  Recent Labs Lab 08/27/12 1820  ETH <11   Urinalysis:  Recent Labs Lab 08/27/12 1932  COLORURINE YELLOW  LABSPEC 1.011    PHURINE 7.0  GLUCOSEU NEGATIVE  HGBUR NEGATIVE  BILIRUBINUR NEGATIVE  KETONESUR NEGATIVE  PROTEINUR NEGATIVE  UROBILINOGEN 0.2  NITRITE NEGATIVE  LEUKOCYTESUR SMALL*   Studies/Results: Mr [name] ZO Contrast  08/29/2012   *RADIOLOGY REPORT*  Clinical Data: Seizures.  Trauma to the head.  MRI HEAD WITHOUT AND WITH CONTRAST  Technique:  Multiplanar, multiecho pulse sequences of the brain and surrounding structures were obtained according to standard protocol without and with intravenous contrast  Contrast: 13mL MULTIHANCE GADOBENATE DIMEGLUMINE 529 MG/ML IV SOLN  Comparison: Head CT 08/27/2012.  MRI 04/06/2008  Findings: The brain appears normally formed.  Diffusion imaging does not show any acute or subacute infarction.  No evidence of mass lesion, hemorrhage, hydrocephalus or extra-axial collection. Mesial temporal lobes appear normal.  Scalp swelling with a focal hematoma in the right parietal vertex region again noted.  No evidence of underlying skull fracture.  After contrast administration, no abnormal enhancement occurs.  IMPRESSION: Right parietal scalp injury.  No visible abnormality of the brain. No underlying lesions seen to explain seizures.   Original Report Authenticated By: Paulina Fusi, M.D.   Medications: I have reviewed the patient's current medications. Scheduled Meds: . clonazePAM  0.5 mg Oral TID  . enoxaparin (LOVENOX) injection  40 mg Subcutaneous Q24H  Continuous Infusions:  PRN Meds:.acetaminophen, acetaminophen, ondansetron (ZOFRAN) IV, ondansetron  Assessment/Plan: # Tonic-Clonic Seizures- Due to benzodiazapine withdrawal. 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 Given loading dose of Keppra 1000 mg IVPB for seizure prophylaxis per neuro in ED, no seizure since admission.  -3rd day on klonopin- 0.5 mg PO  - Xanax totally stopped.   -Neurology to consult,- Investigations for other possibilities that would decrease seizure threshold-  MRI [place] showed no abnormalities that will explain seizures, EEG read pending.  -Patient advised to stop driving as she could put her self and others at risk., and advised to contact the DMV. -Psych recommendations- Switch to klonipine and taper over 2-3 weeks to prevent withdarawal seizures and to add Buspar 10 mg TID for anxiety or Vistaril 25 mg every 6 hours PRN anxiety. Patient does not need inpatient psychiatric treatment. Recommend followup treatment with Dr Madaline Guth[facility] at [facility]. Was also seen by Psych clinical social worker. - Patient will be discharged on clonipine 0.5 mg TID, and was advised to follow up with her psychiatrist to taper her off clonipine and introduce a more effective anxiolytic with less much serious adverse effects. -Patient was advised to stop use of xanax as she could sustain more severe injuries.    # Scalp hematoma -was sustained when patient had a seizure and fell sustaining injury to head. CT head was done on admission. Ct showed no evid of IC bleeding or fractures of the skull.  - Tylenol 650 mg PRN was given for pain cotrol.  # Polysubstance abuse; found to be +ve for opioids and benzos on UDS on admission. She admits to taking her mother's Vicodin on Friday evening. Patient denied thoughts of self harm.   Dispo: Disposition is deferred at this time, awaiting improvement of current medical problems.  Anticipated discharge- today.   The patient does have a current PCP Corwin Levins, MD) and does need an [facility] hospital follow-up appointment after discharge.  The patient does not have transportation limitations that hinder transportation to clinic appointments.  .Services Needed at time of discharge: Y = Yes, Blank = No PT:   OT:   RN:   Equipment:   Other:     LOS: 3 days   Kennis Carina, MD 08/30/2012, 11:53 AM

## 2012-09-03 NOTE — Discharge Summary (Signed)
  Date: 09/03/2012  Patient name: Shelby Branch  Medical record number: [id number]  Date of birth: 10/18/1990   This patient has been discussed with the house staff. Please see their note for complete details. I concur with their findings and plan.  Jonah Blue, DO 09/03/2012, 6:29 PM

## 2012-09-26 ENCOUNTER — Inpatient Hospital Stay (HOSPITAL_COMMUNITY)
Admission: AD | Admit: 2012-09-26 | Discharge: 2012-09-30 | DRG: 897 | Disposition: A | Discharge status: Home / Self Care | Payer: 59 | Source: Intra-hospital | Attending: Psychiatry | Admitting: Psychiatry

## 2012-09-26 ENCOUNTER — Inpatient Hospital Stay (HOSPITAL_COMMUNITY): Admission: AD | Admit: 2012-09-26 | Payer: 59 | Source: Intra-hospital | Admitting: Psychiatry

## 2012-09-26 ENCOUNTER — Emergency Department (HOSPITAL_COMMUNITY)
Admission: EM | Admit: 2012-09-26 | Discharge: 2012-09-26 | Disposition: A | Discharge status: Other Acute Inpatient Hospital | Payer: 59 | Attending: Emergency Medicine | Admitting: Emergency Medicine

## 2012-09-26 ENCOUNTER — Encounter (HOSPITAL_COMMUNITY): Payer: Self-pay | Admitting: *Deleted

## 2012-09-26 ENCOUNTER — Encounter (HOSPITAL_COMMUNITY): Payer: Self-pay

## 2012-09-26 DIAGNOSIS — F19239 Other psychoactive substance dependence with withdrawal, unspecified: Secondary | ICD-10-CM | POA: Diagnosis present

## 2012-09-26 DIAGNOSIS — J45909 Unspecified asthma, uncomplicated: Secondary | ICD-10-CM | POA: Insufficient documentation

## 2012-09-26 DIAGNOSIS — F1920 Other psychoactive substance dependence, uncomplicated: Secondary | ICD-10-CM | POA: Diagnosis present

## 2012-09-26 DIAGNOSIS — Z3202 Encounter for pregnancy test, result negative: Secondary | ICD-10-CM | POA: Insufficient documentation

## 2012-09-26 DIAGNOSIS — F1410 Cocaine abuse, uncomplicated: Secondary | ICD-10-CM

## 2012-09-26 DIAGNOSIS — F19988 Other psychoactive substance use, unspecified with other psychoactive substance-induced disorder: Secondary | ICD-10-CM | POA: Diagnosis present

## 2012-09-26 DIAGNOSIS — F3289 Other specified depressive episodes: Secondary | ICD-10-CM

## 2012-09-26 DIAGNOSIS — F1310 Sedative, hypnotic or anxiolytic abuse, uncomplicated: Secondary | ICD-10-CM

## 2012-09-26 DIAGNOSIS — R630 Anorexia: Secondary | ICD-10-CM | POA: Insufficient documentation

## 2012-09-26 DIAGNOSIS — F341 Dysthymic disorder: Secondary | ICD-10-CM | POA: Insufficient documentation

## 2012-09-26 DIAGNOSIS — R1084 Generalized abdominal pain: Secondary | ICD-10-CM | POA: Insufficient documentation

## 2012-09-26 DIAGNOSIS — R110 Nausea: Secondary | ICD-10-CM | POA: Insufficient documentation

## 2012-09-26 DIAGNOSIS — Z8669 Personal history of other diseases of the nervous system and sense organs: Secondary | ICD-10-CM | POA: Insufficient documentation

## 2012-09-26 DIAGNOSIS — F19939 Other psychoactive substance use, unspecified with withdrawal, unspecified: Principal | ICD-10-CM | POA: Diagnosis present

## 2012-09-26 DIAGNOSIS — F1110 Opioid abuse, uncomplicated: Secondary | ICD-10-CM

## 2012-09-26 DIAGNOSIS — F419 Anxiety disorder, unspecified: Secondary | ICD-10-CM

## 2012-09-26 DIAGNOSIS — F1120 Opioid dependence, uncomplicated: Secondary | ICD-10-CM | POA: Insufficient documentation

## 2012-09-26 DIAGNOSIS — R259 Unspecified abnormal involuntary movements: Secondary | ICD-10-CM | POA: Insufficient documentation

## 2012-09-26 DIAGNOSIS — F320 Major depressive disorder, single episode, mild: Secondary | ICD-10-CM | POA: Diagnosis present

## 2012-09-26 DIAGNOSIS — F17200 Nicotine dependence, unspecified, uncomplicated: Secondary | ICD-10-CM | POA: Insufficient documentation

## 2012-09-26 DIAGNOSIS — F411 Generalized anxiety disorder: Secondary | ICD-10-CM | POA: Diagnosis present

## 2012-09-26 DIAGNOSIS — F1320 Sedative, hypnotic or anxiolytic dependence, uncomplicated: Secondary | ICD-10-CM | POA: Insufficient documentation

## 2012-09-26 DIAGNOSIS — F329 Major depressive disorder, single episode, unspecified: Secondary | ICD-10-CM

## 2012-09-26 DIAGNOSIS — F32A Depression, unspecified: Secondary | ICD-10-CM

## 2012-09-26 DIAGNOSIS — Z79899 Other long term (current) drug therapy: Secondary | ICD-10-CM | POA: Insufficient documentation

## 2012-09-26 DIAGNOSIS — F1420 Cocaine dependence, uncomplicated: Secondary | ICD-10-CM | POA: Insufficient documentation

## 2012-09-26 LAB — COMPREHENSIVE METABOLIC PANEL
ALT: 12 U/L (ref 0–35)
Alkaline Phosphatase: 74 U/L (ref 39–117)
CO2: 27 mEq/L (ref 19–32)
GFR calc Af Amer: >90 mL/min (ref >90)
GFR calc non Af Amer: >90 mL/min (ref >90)
Glucose, Bld: 94 mg/dL (ref 70–99)
Potassium: 3.7 mEq/L (ref 3.5–5.1)
Sodium: 137 mEq/L (ref 135–145)
Total Protein: 8 g/dL (ref 6.0–8.3)

## 2012-09-26 LAB — CBC WITH DIFFERENTIAL/PLATELET
Lymphocytes Relative: 35 % (ref 12–46)
Lymphs Abs: 2.2 10*3/uL (ref 0.7–4.0)
Neutro Abs: 3.5 10*3/uL (ref 1.7–7.7)
Neutrophils Relative %: 54 % (ref 43–77)
Platelets: 416 10*3/uL — ABNORMAL HIGH (ref 150–400)
RBC: 4.21 MIL/uL (ref 3.87–5.11)
WBC: 6.5 10*3/uL (ref 4.0–10.5)

## 2012-09-26 LAB — ETHANOL: Alcohol, Ethyl (B): <11 mg/dL (ref 0–11)

## 2012-09-26 LAB — POCT PREGNANCY, URINE: Preg Test, Ur: NEGATIVE

## 2012-09-26 LAB — RAPID URINE DRUG SCREEN, HOSP PERFORMED: Amphetamines: NOT DETECTED

## 2012-09-26 MED ORDER — NICOTINE 21 MG/24HR TD PT24: 21.0000 mg | MEDICATED_PATCH | Freq: Every day | TRANSDERMAL | Status: DC

## 2012-09-26 MED ORDER — CLONAZEPAM 0.5 MG PO TABS
0.5000 mg | ORAL_TABLET | Freq: Three times a day (TID) | ORAL | Status: DC
  Administered 2012-09-26 (×2): 0.5 mg via ORAL
  Filled 2012-09-26 (×2): qty 1

## 2012-09-26 MED ORDER — LORAZEPAM 1 MG PO TABS: 1.0000 mg | ORAL_TABLET | Freq: Three times a day (TID) | ORAL | Status: DC | PRN

## 2012-09-26 MED ORDER — ONDANSETRON HCL 4 MG PO TABS: 4.0000 mg | ORAL_TABLET | Freq: Three times a day (TID) | ORAL | Status: DC | PRN

## 2012-09-26 MED ORDER — ALPRAZOLAM 0.5 MG PO TABS: 0.5000 mg | ORAL_TABLET | Freq: Four times a day (QID) | ORAL | Status: DC | PRN

## 2012-09-26 MED ORDER — AMPHETAMINE-DEXTROAMPHET ER 10 MG PO CP24
30.0000 mg | ORAL_CAPSULE | Freq: Two times a day (BID) | ORAL | Status: DC
  Filled 2012-09-26: qty 3

## 2012-09-26 MED ORDER — ACETAMINOPHEN 325 MG PO TABS: 650.0000 mg | ORAL_TABLET | ORAL | Status: DC | PRN

## 2012-09-26 MED ORDER — ALUM & MAG HYDROXIDE-SIMETH 200-200-20 MG/5ML PO SUSP: 30.0000 mL | ORAL | Status: DC | PRN

## 2012-09-26 MED ORDER — IBUPROFEN 200 MG PO TABS: 600.0000 mg | ORAL_TABLET | Freq: Three times a day (TID) | ORAL | Status: DC | PRN

## 2012-09-26 MED ORDER — ZOLPIDEM TARTRATE 5 MG PO TABS: 5.0000 mg | ORAL_TABLET | Freq: Every evening | ORAL | Status: DC | PRN

## 2012-09-26 NOTE — ED Provider Notes (Signed)
CSN: [id number]     Arrival date & time 09/26/12  1618 History     First MD Initiated Contact with Patient 09/26/12 1645     Chief Complaint  Patient presents with  . Medical Clearance  . Addiction Problem    heroin, xanax, cocaine   (Consider location/radiation/quality/duration/timing/severity/associated sxs/prior Treatment) HPI Pt is a [place] female with hx of anxiety and depression requesting detox from heroin, cocaine, and xanax.  Pt has a hx of seizure from xanax and is requesting in-pt tx.  Last used heroin last night and cocaine 3 days ago. Has been experiencing nausea and tremors for last few hours. Also reports mild diffuse abdominal pain and decreased appetite.  Denies fever, vomiting, or diarrhea.  Denies SI/HI.  Denies significant PMH.  Denies bipolar disorder or schizophrenia.  Denies visual or auditory hallucinations.    Past Medical History  Diagnosis Date  . Asthma   . Depression   . Anxiety   . Headache(784.0)    History reviewed. No pertinent past surgical history. Family History  Problem Relation Age of Onset  . Depression Mother   . Anxiety disorder Mother    History  Substance Use Topics  . Smoking status: Current Some Day Smoker -- 0.25 packs/day    Types: Cigarettes  . Smokeless tobacco: Never Used  . Alcohol Use: No   OB History   Grav Para Term Preterm Abortions TAB SAB Ect Mult Living                 Review of Systems  Constitutional: Positive for chills and appetite change ( decreased). Negative for fever, diaphoresis and fatigue.  Respiratory: Negative for shortness of breath.   Cardiovascular: Negative for chest pain.  All other systems reviewed and are negative.    Allergies  Review of patient's allergies indicates no known allergies.  Home Medications   Current Outpatient Rx  Name  Route  Sig  Dispense  Refill  . ALPRAZolam (XANAX) 0.5 MG tablet   Oral   Take 0.5 mg by mouth 4 (four) times daily as needed for sleep.         Marland Kitchen  amphetamine-dextroamphetamine (ADDERALL XR) 30 MG 24 hr capsule   Oral   Take 30 mg by mouth 2 (two) times daily.         . clonazePAM (KLONOPIN) 0.5 MG tablet   Oral   Take 1 tablet (0.5 mg total) by mouth 3 (three) times daily.   90 tablet   0    BP 117/77  Pulse 54  Temp(Src) 98.9 F (37.2 C) (Oral)  Resp 20  SpO2 98%  LMP 06/27/2012 Physical Exam  Nursing note and vitals reviewed. Constitutional: She appears well-developed and well-nourished. No distress.  HENT:  Head: Normocephalic and atraumatic.  Eyes: Conjunctivae are normal. No scleral icterus.  Neck: Normal range of motion.  Cardiovascular: Normal rate, regular rhythm and normal heart sounds.   Pulmonary/Chest: Effort normal and breath sounds normal. No respiratory distress. She has no wheezes. She has no rales. She exhibits no tenderness.  Abdominal: Soft. Bowel sounds are normal. She exhibits no distension and no mass. There is tenderness ( mild, generalized). There is no rebound and no guarding.  Musculoskeletal: Normal range of motion.  Neurological: She is alert.  Skin: Skin is warm and dry. She is not diaphoretic.  Psychiatric: Her behavior is normal. She is not actively hallucinating. Thought content is not delusional. She exhibits a depressed mood. She expresses no homicidal and  no suicidal ideation. She expresses no suicidal plans and no homicidal plans.    ED Course   Procedures (including critical care time)  Labs Reviewed  CBC WITH DIFFERENTIAL - Abnormal; Notable for the following:    Platelets 416 (*)    All other components within normal limits  URINE RAPID DRUG SCREEN (HOSP PERFORMED) - Abnormal; Notable for the following:    Opiates POSITIVE (*)    Benzodiazepines POSITIVE (*)    Tetrahydrocannabinol POSITIVE (*)    All other components within normal limits  COMPREHENSIVE METABOLIC PANEL  ETHANOL  POCT PREGNANCY, URINE   No results found. 1. Heroin abuse   2. Cocaine abuse   3.  Benzodiazepine abuse   4. Anxiety and depression     MDM  Pt is to be admitted for detox from heroin, cocaine, and xanax due to hx of seizures from withdrawal from xanax. Denies alcohol abuse. Denies SI/HI.  Denies auditory or visual hallucinations. Spoke with  Sink from Psych ED, pt is medically cleared to be moved to psych ED for further evaluation and disposition.    Junius Finner, PA-C 09/26/12 1933

## 2012-09-26 NOTE — [facility] Counselor (Signed)
Pt has been accepted to Dr. Dub Mikes, bed 301-1.Noland Fordyce of [facility] came into the office and wanted to know the status of the pt's bed availability, Marcelino Duster had spoken with Minerva Areola, RN-AC. Per Marcelino Duster, Clinical research associate was informed that the pt's parents are planning to pull her out of [facility], tomorrow to send her to a private treatment center because "since it's her first time, they think it would be best if she wasn't around so many people that would influence her". Writer had the pt sign the voluntary admission and release of information forms, forms have been faxed to the Triage Office. Nurse has been informed. Denice Bors, AADC 09/26/2012 9:04 PM

## 2012-09-26 NOTE — ED Notes (Signed)
Per EMS- Patient is requesting detox from heroin, cocaine, and xanax. Patient last used coaine 3 days ago and heroin last night. Patient c/o chills. And reports that she has had seizures when she is detoxing.

## 2012-09-26 NOTE — ED Provider Notes (Signed)
Medical screening examination/treatment/procedure(s) were performed by non-physician practitioner and as supervising physician I was immediately available for consultation/collaboration.    Nelia Shi, MD 09/26/12 [phone]

## 2012-09-26 NOTE — ED Provider Notes (Signed)
Accepted to [facility] by Dara Hoyer, MD 09/26/12 2046

## 2012-09-27 ENCOUNTER — Encounter (HOSPITAL_COMMUNITY): Payer: Self-pay | Admitting: Psychiatry

## 2012-09-27 DIAGNOSIS — F339 Major depressive disorder, recurrent, unspecified: Secondary | ICD-10-CM

## 2012-09-27 DIAGNOSIS — F1210 Cannabis abuse, uncomplicated: Secondary | ICD-10-CM

## 2012-09-27 DIAGNOSIS — F1320 Sedative, hypnotic or anxiolytic dependence, uncomplicated: Secondary | ICD-10-CM

## 2012-09-27 DIAGNOSIS — F1120 Opioid dependence, uncomplicated: Secondary | ICD-10-CM

## 2012-09-27 MED ORDER — CLONIDINE HCL 0.1 MG PO TABS
0.1000 mg | ORAL_TABLET | ORAL | Status: DC
  Administered 2012-09-29 – 2012-09-30 (×2): 0.1 mg via ORAL
  Filled 2012-09-27 (×4): qty 1

## 2012-09-27 MED ORDER — NAPROXEN 500 MG PO TABS
500.0000 mg | ORAL_TABLET | Freq: Two times a day (BID) | ORAL | Status: DC | PRN
  Administered 2012-09-29: 500 mg via ORAL
  Filled 2012-09-27: qty 1

## 2012-09-27 MED ORDER — HYDROXYZINE HCL 25 MG PO TABS
25.0000 mg | ORAL_TABLET | Freq: Four times a day (QID) | ORAL | Status: DC | PRN
  Administered 2012-09-27 (×2): 25 mg via ORAL
  Filled 2012-09-27: qty 1

## 2012-09-27 MED ORDER — ALUM & MAG HYDROXIDE-SIMETH 200-200-20 MG/5ML PO SUSP: 30.0000 mL | ORAL | Status: DC | PRN

## 2012-09-27 MED ORDER — ACETAMINOPHEN 325 MG PO TABS
650.0000 mg | ORAL_TABLET | Freq: Four times a day (QID) | ORAL | Status: DC | PRN
  Administered 2012-09-27 – 2012-09-28 (×2): 650 mg via ORAL

## 2012-09-27 MED ORDER — CHLORDIAZEPOXIDE HCL 25 MG PO CAPS: 25.0000 mg | ORAL_CAPSULE | Freq: Four times a day (QID) | ORAL | Status: DC | PRN

## 2012-09-27 MED ORDER — TRAZODONE HCL 50 MG PO TABS
50.0000 mg | ORAL_TABLET | Freq: Every evening | ORAL | Status: DC | PRN
  Administered 2012-09-27 – 2012-09-29 (×5): 50 mg via ORAL
  Filled 2012-09-27: qty 28
  Filled 2012-09-27 (×9): qty 1
  Filled 2012-09-27: qty 28

## 2012-09-27 MED ORDER — CLONIDINE HCL 0.1 MG PO TABS
0.1000 mg | ORAL_TABLET | Freq: Every day | ORAL | Status: DC
  Filled 2012-09-27: qty 1

## 2012-09-27 MED ORDER — DICYCLOMINE HCL 20 MG PO TABS
20.0000 mg | ORAL_TABLET | Freq: Four times a day (QID) | ORAL | Status: DC | PRN
  Administered 2012-09-27 (×2): 20 mg via ORAL
  Filled 2012-09-27 (×2): qty 1

## 2012-09-27 MED ORDER — LOPERAMIDE HCL 2 MG PO CAPS: 2.0000 mg | ORAL_CAPSULE | ORAL | Status: DC | PRN

## 2012-09-27 MED ORDER — ONDANSETRON 4 MG PO TBDP: 4.0000 mg | ORAL_TABLET | Freq: Four times a day (QID) | ORAL | Status: DC | PRN

## 2012-09-27 MED ORDER — METHOCARBAMOL 500 MG PO TABS: 500.0000 mg | ORAL_TABLET | Freq: Three times a day (TID) | ORAL | Status: DC | PRN

## 2012-09-27 MED ORDER — MAGNESIUM HYDROXIDE 400 MG/5ML PO SUSP: 30.0000 mL | Freq: Every day | ORAL | Status: DC | PRN

## 2012-09-27 MED ORDER — CLONIDINE HCL 0.1 MG PO TABS
0.1000 mg | ORAL_TABLET | Freq: Four times a day (QID) | ORAL | Status: AC
  Administered 2012-09-27 – 2012-09-29 (×10): 0.1 mg via ORAL
  Filled 2012-09-27 (×10): qty 1

## 2012-09-27 NOTE — [facility] Counselor (Signed)
Adult Comprehensive Assessment  Patient ID: Shelby Branch, female   DOB: [birth year], 22 y.o.   MRN: [id number]  Information Source: Information source: Patient  Current Stressors:  Educational / Learning stressors: currently student Lobbyist degree for high school) Employment / Job issues: looking for employment Family Relationships: close to family Financial / Lack of resources (include bankruptcy): none identified. Pt lives with her parents who take care of bills Housing / Lack of housing: lives at home with parents  Physical health (include injuries & life threatening diseases): none identified Social relationships: close friends. some are drug addicts and some are not.  Substance abuse: 1 gram of heroin daily for about 9 months. occassional cocaine and marijuana use. no alcohol use identified.  Bereavement / Loss: "i'm still grieving the loss of my uncle randy." "He died 3 years ago."  Living/Environment/Situation:  Living Arrangements: Parent (mother and father) Living conditions (as described by patient or guardian): supportive, clean, safe environment.  How long has patient lived in current situation?: "I've lived there since I was born." What is atmosphere in current home: Supportive;Comfortable  Family History:  Marital status: Single  Childhood History:  By whom was/is the patient raised?: Both parents Additional childhood history information: Parents were married throughout childhood. "I had a good childhood." Description of patient's relationship with caregiver when they were a child: Close to both parents as a child. Patient's description of current relationship with people who raised him/her: Close to both parents. Very tightknit family Does patient have siblings?: Yes Number of Siblings: 5 Description of patient's current relationship with siblings: 3 brothers and 2 sisters. Second youngest child.  Did patient suffer any verbal/emotional/physical/sexual abuse  as a child?: No Did patient suffer from severe childhood neglect?: No Has patient ever been sexually abused/assaulted/raped as an adolescent or adult?: No Was the patient ever a victim of a crime or a disaster?: No Witnessed domestic violence?: No Has patient been effected by domestic violence as an adult?: No  Education:  Highest grade of school patient has completed: 11th grade Currently a student?: Yes If yes, how has current illness impacted academic performance: Distance myself from the computer; not concerned with school work.  Name of school: [employer]fin[employer].  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 No alcohol use identified. Occassional cocaine and marijuana use.  If attempted suicide, did drugs/alcohol play a role in this?: No Alcohol/Substance Abuse Treatment Hx: Denies past history If yes, describe treatment: n/a  Has alcohol/substance abuse ever caused legal problems?: No  Social Support System:   Patient's Community Support System: Good Describe Community Support System: Friends from school; some don't use drugs but some do.  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 No therapist) If no, would patient like referral for services when discharged?: Yes (What county?) [facility] county) Does patient have financial barriers related to discharge medications?: No Patient description of barriers related to discharge medications: private insurance  Summary/Recommendations:    Pt is 22 year old female living in [place], [place] with her parents. 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 Pt reports heroin use for past 9 months. She has history of anxiety and depression. Recommendations for pt include: therapeutic milieu, encourage group attendence and participation, clonidine taper for withdrawals, medication management for mood stabilization, and development of comprehensive mental wellness/sobriety plan.   Smart, Research scientist (physical sciences). 09/27/2012

## 2012-09-27 NOTE — [facility] Group Notes (Signed)
[facility] LCSW Group Therapy  09/27/2012 4:09 PM  Type of Therapy:  Group Therapy  Participation Level:  Minimal  Participation Quality:  Drowsy  Affect:  Depressed  Cognitive:  Alert  Insight:  Limited  Engagement in Therapy:  None  Modes of Intervention:  Discussion, Education, Exploration, Socialization and Support  Summary of Progress/Problems: MHA Speaker came to talk about his personal journey with substance abuse and addiction.MHA speaker provided handouts and educational information pertaining to groups and services offered by the [facility]. Anyssa was inattentive and disengaged throughout group. She presented with depressed mood and lethargic affect. Vernona had eyes closed during majority of group. At this time, she does not appear to be making progress in the group setting.    Smart, Avyn Aden 09/27/2012, 4:09 PM

## 2012-09-27 NOTE — Progress Notes (Signed)
Recreation Therapy Notes   Date: 08.19.2014 Time: 3:00pm Location: 300 Hall Dayroom  Group Topic: Communication, Team Building, Problem Solving  Goal Area(s) Addresses:  Patient will be able to recognize use of communication, team building and problem solving during course of group activity. Patient will verbalize qualities used to make decisions during group session.  Patient will verbalize ability to use skills to build healthy support system post d/c.   Behavioral Response: Appropriate, Engaged, Attentive, Active Listener  Intervention: Problem Solving Activity  Activity: Life Boat. Patients were given a scenario about being caught on a sinking ship. In this scenario patients were informed all members of group session, in addition to 8 individuals off of a list of 15 could fit on the life boat. List of individuals included people from all socioeconomic status, for example: President Obama, Midwife, Runner, broadcasting/film/video, Education officer, museum.   Education: Life Skills, Discharge Planning, Relapse Prevention  Education Outcome: Acknowledges understanding   Clinical Observations/Feedback: Patient participated in group session. Patient made no contributions to opening discussion, but appeared to actively listen as she maintained appropriate eye contact with speaker. Patient actively participated in group activity, voicing her opinion and debating appropriately with other group members. Patient made no contributions to wrap up discussion, but appeared to actively listen as she maintained appropriate eye contact and nodded her head in agreement with points of interest.     Jearl Klinefelter, LRT/CTRS  Jearl Klinefelter 09/27/2012 4:23 PM

## 2012-09-27 NOTE — Progress Notes (Signed)
Adult Psychoeducational Group Note  Date:  09/27/2012 Time:  9:33 PM  Group Topic/Focus:  Identifying Needs:   The focus of this group is to help patients identify their personal needs that have been historically problematic and identify healthy behaviors to address their needs.  Participation Level:  Active  Participation Quality:  Appropriate, Sharing and Supportive  Affect:  Appropriate and Excited  Cognitive:  Alert, Appropriate and Oriented  Insight: Appropriate and Good  Engagement in Group:  Engaged, Improving and Supportive  Modes of Intervention:  Discussion, Problem-solving and Support  Additional Comments:  Calaya shared with the group how her self care card related to her everyday life.  She stated that her card pleasure reminded her that it is ok to smile and bring laugher to her life.    Annell Greening [place] 09/27/2012, 9:33 PM

## 2012-09-27 NOTE — BH Assessment (Signed)
Assessment Note  Shelby Branch is an 22 y.o. female. Assessment by Stann Mainland. Roslynn Amble [facility] 09/26/12 QP  dispatched to assess a 22 year old Caucasian female requesting substance abuse detox off of opioids, cocaine, and marijuana. Her mental health provider is Dr. Phillip Heal and her medical provider is at [facility]. Clt presented with a sloppy appearance and minimal eye contact. She is flat and guarded during the assessment. She is experiencing depression, with fatigue, insomnia, poor appetite with a weight loss of over 10 pounds in the last 3 months. She has high anxiety with compulsive thoughts. She has no SI, HI or psychosis at this time. In the past, she has been diagnosed with Major Depressive Disorder, Recurrent, Moderate by Dr. Phillip Heal. Clt stated she has cut herself in the past when she was in high schoolbut has never been formally hospitalized for treatment. Her treatment is now managed with medication and office visits with her psychiatrist. However, she is not medication compliant on all her medications at this time. Clt stated she has used marijuana, cocaine, heroin, and Xanax in the last 24 hrs. Currently, she is in withdrawal to include cramps, nausea, diarrhea, tachycardia, agitation, weakness, fever, chills and sweats. She has a history of seizures during detox off of opioids in the past. Her last seizure was on 08/21/12 when she went to [facility] for withdrawal symptoms. At this time, she was discharged when she refused to complete inpatient treatment per collateral report. In addition to seizures, she has the following medical conditions asthma, migraines, and irregular menstrual cycle.   Axis I: Major Depressive Disorder, Recurrent, Moderate            304.00 Opioid Use, Severe             304.20 Cocaine Use, Severe Axis II: Deferred Axis III:  Past Medical History  Diagnosis Date  . Asthma   . Depression   . Anxiety   .  Headache(784.0)    Axis IV: other psychosocial or environmental problems and problems related to social environment Axis V: 41-50 serious symptoms  Past Medical History:  Past Medical History  Diagnosis Date  . Asthma   . Depression   . Anxiety   . Headache(784.0)     History reviewed. No pertinent past surgical history.  Family History:  Family History  Problem Relation Age of Onset  . Depression Mother   . Anxiety disorder Mother     Social History:  reports that she has been smoking Cigarettes.  She has a .75 pack-year smoking history. She has never used smokeless tobacco. She reports that she uses illicit drugs (Cocaine, Marijuana, and Heroin). She reports that she does not drink alcohol.  Additional Social History:  Alcohol / Drug Use Pain Medications: none - pt denies abuse Prescriptions: see PTA meds list Over the Counter: none History of alcohol / drug use?: Yes Negative Consequences of Use: Work / School;Personal relationships;Financial Substance #1 Name of Substance 1: Cocaine 1 - Age of First Use: 21 1 - Frequency: once monthly 1 - Last Use / Amount: 3 days ago Substance #2 Name of Substance 2: Heorin 2 - Age of First Use: 20 2 - Amount (size/oz): 1 gm 2 - Frequency: for the past 6 months use 3 times daily 2 - Last Use / Amount: last night Substance #3 Name of Substance 3: THC 3 - Age of First Use: 15 3 - Frequency: once every 3  to 4 months 3 - Last Use / Amount: last night  CIWA: CIWA-Ar BP: 113/83 mmHg Pulse Rate: 86 Nausea and Vomiting: no nausea and no vomiting Tactile Disturbances: none Tremor: no tremor Auditory Disturbances: not present Paroxysmal Sweats: no sweat visible Visual Disturbances: not present Anxiety: no anxiety, at ease Headache, Fullness in Head: moderate Agitation: normal activity Orientation and Clouding of Sensorium: oriented and can do serial additions CIWA-Ar Total: 3 COWS: Clinical Opiate Withdrawal Scale  (COWS) Sweating: No report of chills or flushing Restlessness: Able to sit still Pupil Size: Pupils pinned or normal size for room light Bone or Joint Aches: Not present Runny Nose or Tearing: Not present GI Upset: Stomach cramps Tremor: No tremor Yawning: No yawning Anxiety or Irritability: None Gooseflesh Skin: Skin is smooth  Allergies: No Known Allergies  Home Medications:  Medications Prior to Admission  Medication Sig Dispense Refill  . ALPRAZolam (XANAX) 0.5 MG tablet Take 0.5 mg by mouth 4 (four) times daily as needed for sleep.      Marland Kitchen amphetamine-dextroamphetamine (ADDERALL XR) 30 MG 24 hr capsule Take 30 mg by mouth 2 (two) times daily.      . clonazePAM (KLONOPIN) 0.5 MG tablet Take 1 tablet (0.5 mg total) by mouth 3 (three) times daily.  90 tablet  0    OB/GYN Status:  Patient's last menstrual period was 06/27/2012.  General Assessment Data Location of Assessment: [facility] Is this a Tele or Face-to-Face Assessment?:  (telephone assessment from [facility]) Is this an Initial Assessment or a Re-assessment for this encounter?: Initial Assessment Living Arrangements: Parent;Other relatives Can pt return to current living arrangement?: Yes Admission Status: Voluntary Is patient capable of signing voluntary admission?: Yes Transfer from: Acute Hospital Referral Source: Other     [facility] Crisis Care Plan Living Arrangements: Parent;Other relatives  Education Status Is patient currently in school?: No  Risk to self Suicidal Ideation: No Suicidal Intent: No Is patient at risk for suicide?: Yes Suicidal Plan?: No Access to Means: No What has been your use of drugs/alcohol within the last 12 months?: heroin, cocaine, marijuana & xanax Previous Attempts/Gestures: No How many times?: 0 Other Self Harm Risks: none Triggers for Past Attempts:  (n/a) Intentional Self Injurious Behavior: None Family Suicide History: No Recent stressful life  event(s): Financial Problems;Loss (Comment) (breakup with boyfriend) Persecutory voices/beliefs?: No Depression: Yes Depression Symptoms: Guilt;Fatigue;Insomnia;Despondent;Loss of interest in usual pleasures;Feeling angry/irritable;Tearfulness;Isolating;Feeling worthless/self pity Substance abuse history and/or treatment for substance abuse?: Yes Suicide prevention information given to non-admitted patients: Not applicable  Risk to Others Homicidal Ideation: No Thoughts of Harm to Others: No Current Homicidal Intent: No Current Homicidal Plan: No Access to Homicidal Means: No Identified Victim: none History of harm to others?: No Assessment of Violence: None Noted Criminal Charges Pending?: No Does patient have a court date: No  Psychosis Hallucinations: None noted Delusions: None noted  Mental Status Report Appear/Hygiene: Disheveled Eye Contact: Fair Motor Activity: Freedom of movement Speech: Logical/coherent Level of Consciousness: Drowsy;Other (Comment) (letharic) Mood: Depressed Affect: Depressed Anxiety Level: Moderate Thought Processes: Relevant;Coherent Judgement: Unimpaired Orientation: Person;Place;Situation;Time Obsessive Compulsive Thoughts/Behaviors: None  Cognitive Functioning Concentration: Decreased Memory: Remote Intact;Recent Intact IQ: Average Insight: Fair Impulse Control: Fair Appetite: Poor Weight Loss: 10 (in past 3 mos) Weight Gain: 0 Sleep: Decreased Vegetative Symptoms: None  ADLScreening [facility]) Patient's cognitive ability adequate to safely complete daily activities?: Yes Patient able to express need for assistance with ADLs?: Yes Independently performs ADLs?: Yes (appropriate for developmental age)  Prior Inpatient Therapy Prior Inpatient Therapy: Yes Prior Therapy Dates: July 2014 Prior Therapy Facilty/Provider(s): [facility] Reason for Treatment: detox  Prior Outpatient Therapy Prior Outpatient  Therapy: Yes Prior Therapy Dates: currently Prior Therapy Facilty/Provider(s): Dr. Phillip Heal Reason for Treatment: medication, therapy  ADL Screening (condition at time of admission) Patient's cognitive ability adequate to safely complete daily activities?: Yes Is the patient deaf or have difficulty hearing?: No Does the patient have difficulty seeing, even when wearing glasses/contacts?: No Does the patient have difficulty concentrating, remembering, or making decisions?: No Patient able to express need for assistance with ADLs?: Yes Does the patient have difficulty dressing or bathing?: No Independently performs ADLs?: Yes (appropriate for developmental age) Does the patient have difficulty walking or climbing stairs?: No Weakness of Legs: None Weakness of Arms/Hands: None  Home Assistive Devices/Equipment Home Assistive Devices/Equipment: None  Therapy Consults (therapy consults require a physician order) PT Evaluation Needed: No OT Evalulation Needed: No SLP Evaluation Needed: No Abuse/Neglect Assessment (Assessment to be complete while patient is alone) Physical Abuse: Denies Verbal Abuse: Denies Sexual Abuse: Denies Exploitation of patient/patient's resources: Denies Self-Neglect: Denies Values / Beliefs Cultural Requests During Hospitalization: None Spiritual Requests During Hospitalization: None Consults Spiritual Care Consult Needed: No Social Work Consult Needed: No Merchant navy officer (For Healthcare) Advance Directive: Patient does not have advance directive Pre-existing out of facility DNR order (yellow form or pink MOST form): No Nutrition Screen- [facility] Adult/WL/AP Patient's home diet: Regular  Additional Information 1:1 In Past 12 Months?: No CIRT Risk: No Elopement Risk: No Does patient have medical clearance?: Yes     Disposition:  Disposition Initial Assessment Completed for this Encounter: Yes Disposition of Patient: Inpatient treatment program  (accepted [place] to [place], [address], pt in [place] )  On Site Evaluation by:   Reviewed with Physician:    Donnamarie Rossetti P 09/27/2012 8:48 AM

## 2012-09-27 NOTE — Tx Team (Signed)
Initial Interdisciplinary Treatment Plan  PATIENT STRENGTHS: (choose at least two) Ability for insight Average or above average intelligence Capable of independent living Communication skills General fund of knowledge Motivation for treatment/growth Physical Health Supportive family/friends Work skills  PATIENT STRESSORS: Financial difficulties Health problems Marital or family conflict Medication change or noncompliance Substance abuse   PROBLEM LIST: Problem List/Patient Goals Date to be addressed Date deferred Reason deferred Estimated date of resolution  "I wanna be sober" 09/26/12     "I wanna stop chasing drugs and spending money" 09/26/12           Depression 09/26/12     Increased risk for suicide 09/26/12     Substance abuse 09/26/12                        DISCHARGE CRITERIA:  Ability to meet basic life and health needs Adequate post-discharge living arrangements Improved stabilization in mood, thinking, and/or behavior Medical problems require only outpatient monitoring Motivation to continue treatment in a less acute level of care Need for constant or close observation no longer present Reduction of life-threatening or endangering symptoms to within safe limits Safe-care adequate arrangements made Verbal commitment to aftercare and medication compliance Withdrawal symptoms are absent or subacute and managed without 24-hour nursing intervention  PRELIMINARY DISCHARGE PLAN: Attend PHP/IOP Attend 12-step recovery group Participate in family therapy Return to previous living arrangement  PATIENT/FAMIILY INVOLVEMENT: This treatment plan has been presented to and reviewed with the patient, Shelby Branch, and/or family member.  The patient and family have been given the opportunity to ask questions and make suggestions.  Shelby Branch 09/27/2012, 12:08 AM

## 2012-09-27 NOTE — [facility] Suicide Risk Assessment (Signed)
Suicide Risk Assessment  Admission Assessment     Nursing information obtained from:  Patient Demographic factors:  Caucasian;Unemployed;Adolescent or young adult Current Mental Status:  NA Loss Factors:  Loss of significant relationship;Decline in physical health;Financial problems / change in socioeconomic status Historical Factors:  Family history of mental illness or substance abuse Risk Reduction Factors:  Living with another person, especially a relative  CLINICAL FACTORS:   Depression:   Comorbid alcohol abuse/dependence Alcohol/Substance Abuse/Dependencies  COGNITIVE FEATURES THAT CONTRIBUTE TO RISK:  Closed-mindedness Polarized thinking Thought constriction (tunnel vision)    SUICIDE RISK:   Moderate:  Frequent suicidal ideation with limited intensity, and duration, some specificity in terms of plans, no associated intent, good self-control, limited dysphoria/symptomatology, some risk factors present, and identifiable protective factors, including available and accessible social support.  PLAN OF CARE: Supportive approach/coping skills/relapse prevention                               Detox/reassess co morbidities                                 I certify that inpatient services furnished can reasonably be expected to improve the patient's condition.  Shelby Branch A 09/27/2012, 4:49 PM

## 2012-09-27 NOTE — Progress Notes (Signed)
Adult Psychoeducational Group Note  Date: 09/27/2012  Time: 1:07 PM  Group Topic/Focus:  Recovery Goals: The focus of this group is to identify appropriate goals for recovery and establish a plan to achieve them.  Participation Level: Active  Participation Quality: Appropriate, Sharing and Supportive  Affect: Appropriate  Cognitive: Appropriate  Insight: Good  Engagement in Group: Engaged and Supportive  Modes of Intervention: Discussion, Education and Support  Additional Comments: Pt was appropriate and participated well.  Carigan Lister M  09/27/2012, 1:07 PM  

## 2012-09-27 NOTE — Progress Notes (Signed)
The focus of this group is to educate the patient on the purpose and policies of crisis stabilization and provide a format to answer questions about their admission.  The group details unit policies and expectations of patients while admitted. Patient did not attend. 

## 2012-09-27 NOTE — H&P (Signed)
Psychiatric Admission Assessment Adult  Patient Identification:  Shelby Branch  Date of Evaluation:  09/27/2012  Chief Complaint:  POLYSUBSTANCE DEPENDENCE MAJOR DEPRESSIVE DISORDER, MODERATE  History of Present Illness: This is a 22 year old Caucasian female. Admitted to [facility] from the [facility] ED with complaints of increased anxiety, compulsive thoughts and a request for drug detoxification treatment. Patient reports, "My brother took me to the [facility] yesterday. He is an addict too. He wanted to go to the hospital to get some help for his substance addiction. And I said to him that I needed help for my own addiction too. I need detoxification treatment for heroin addiction/dependence. I have been abusing Marijuana, cocaine and Xanax too. Xanax was prescribed to me by my psychiatrist, Dr. Phillip Heal. I have been abusing Heroin since the last 6-7 months daily. Heroin gets me high. Getting high takes all my pain away. I have financial problems, and I'm alienated from family because of my drug use. It hurts to know that I have lost all the people that I cared about, that should be in my life. I did not finish high school and or has GED. I use drugs mostly to stop me from going into withdrawals. It is a horrible feeling to be withdrawing from these stuff. I have had seizures, the latest was last month. I had passed out, I mean practically gone, until some gave me a shot of Narcan. I need help. I'm going through some tough time right now; cold sweats, cold chills, hot flashes, muscle cramps, abdominal cramps, etc".  Elements:  Location:  [facility] adult unit. Quality:  Cravings, tremors, restlessness, body aches/cramps, abdominal cramps, nausea, high anxiety. Severity:  Severe, using heroin on daily basis. Timing:  My daily heroin use worsened ove  1 week ago.. Duration:  Started using heroin heavily 6-7 months ago". Context:  Tried heroin first time at 45, liked it, started abusing  marijuana at 89, drug use has alienated her from her family, has no job, no life and no education..  Associated Signs/Synptoms:  Depression Symptoms:  insomnia, psychomotor agitation, feelings of worthlessness/guilt, hopelessness, anxiety, panic attacks, loss of energy/fatigue, weight loss,  (Hypo) Manic Symptoms:  Impulsivity,  Anxiety Symptoms:  Excessive Worry, Panic Symptoms,  Psychotic Symptoms:  Hallucinations: Denies  PTSD Symptoms: Had a traumatic exposure:  Denies any hx of traumatic event in her life.  Psychiatric Specialty Exam: Physical Exam  Constitutional: She is oriented to person, place, and time. She appears well-developed and well-nourished.  HENT:  Head: Normocephalic.  Eyes: Pupils are equal, round, and reactive to light.  Neck: Normal range of motion.  Cardiovascular: Normal rate.   Respiratory: Effort normal and breath sounds normal.  GI: Soft.  Musculoskeletal: Normal range of motion.  Neurological: She is alert and oriented to person, place, and time.  Skin: Skin is warm and dry.  Psychiatric: Her speech is normal. Her mood appears anxious (Rated @ #8). Her affect is not angry. She is withdrawn. Thought content is not paranoid and not delusional. Cognition and memory are normal. She expresses impulsivity. She does not exhibit a depressed mood. She expresses no homicidal and no suicidal ideation. She expresses no suicidal plans and no homicidal plans.    Review of Systems  Constitutional: Positive for chills, weight loss, malaise/fatigue and diaphoresis.  HENT: Negative.   Eyes: Negative.   Respiratory: Negative.   Cardiovascular: Negative.   Gastrointestinal: Negative.   Genitourinary: Negative.   Musculoskeletal: Positive for  myalgias.  Skin: Negative.   Neurological: Positive for dizziness, tremors and weakness.  Endo/Heme/Allergies: Negative.   Psychiatric/Behavioral: Positive for substance abuse (Heroin, benzodiazepine + THC addiction).  Negative for hallucinations and memory loss. The patient is nervous/anxious (Rated at #8) and has insomnia.     Blood pressure 113/83, pulse 86, temperature 98.4 F (36.9 C), temperature source Oral, resp. rate 16, height 5\' 4"  (1.626 m), weight 55.339 kg (122 lb), last menstrual period 06/27/2012.Body mass index is 20.93 kg/(m^2).  General Appearance: Disheveled  Eye Contact::  Minimal  Speech:  Clear and Coherent  Volume:  Normal  Mood:  Anxious and rated at #8, but denies feeling depressed.  Affect:  Flat  Thought Process:  Coherent, Goal Directed and Intact  Orientation:  Full (Time, Place, and Person)  Thought Content:  Rumination and denies any hallucinations, delusions and or paranoia  Suicidal Thoughts:  No  Homicidal Thoughts:  No  Memory:  Immediate;   Good Recent;   Good Remote;   Good  Judgement:  Fair  Insight:  Fair  Psychomotor Activity:  Increased, Restlessness, Tremor and high anxiety  Concentration:  Fair  Recall:  Good  Akathisia:  No  Handed:  Right  AIMS (if indicated):     Assets:  Communication Skills Desire for Improvement Physical Health  Sleep:  Number of Hours: 4.25    Past Psychiatric History: Diagnosis: Opioid dependence, Benzodiazepine dependence, Cannabis abuse, Major depressive disorder, recurrent episodes.  Hospitalizations: [facility]   Outpatient Care: With Dr. Phillip Heal  Substance Abuse Care: None reported  Self-Mutilation: Denies any hx of  Suicidal Attempts: Denies attempts and or thoughts  Violent Behaviors: Denies   Past Medical History:   Past Medical History  Diagnosis Date  . Asthma   . Depression   . Anxiety   . IONGEXBM(841.3)    Seizure History:  Hx withdrawal seizures, last seizure activity, July 2014. Cardiac History:  Asthma  Allergies:  No Known Allergies  PTA Medications: Prescriptions prior to admission  Medication Sig Dispense Refill  . ALPRAZolam (XANAX) 0.5 MG tablet Take 0.5 mg by mouth 4 (four) times daily  as needed for sleep.      Marland Kitchen amphetamine-dextroamphetamine (ADDERALL XR) 30 MG 24 hr capsule Take 30 mg by mouth 2 (two) times daily.      . clonazePAM (KLONOPIN) 0.5 MG tablet Take 1 tablet (0.5 mg total) by mouth 3 (three) times daily.  90 tablet  0    Previous Psychotropic Medications:  Medication/Dose  See medication lists               Substance Abuse History in the last 12 months:  yes  Consequences of Substance Abuse: Medical Consequences:  Liver damage, Possible death by overdose Legal Consequences:  Arrests, jail time, Loss of driving privilege. Family Consequences:  Family discord, divorce and or separation.  Social History:  reports that she has been smoking Cigarettes.  She has a .75 pack-year smoking history. She has never used smokeless tobacco. She reports that she uses illicit drugs (Cocaine, Marijuana, and Heroin). She reports that she does not drink alcohol. Additional Social History: Pain Medications: none - pt denies abuse Prescriptions: see PTA meds list Over the Counter: none History of alcohol / drug use?: Yes Negative Consequences of Use: Work / School;Personal relationships;Financial Name of Substance 1: Cocaine 1 - Age of First Use: 21 1 - Frequency: once monthly 1 - Last Use / Amount: 3 days ago Name of Substance 2: Heorin 2 -  Age of First Use: 20 2 - Amount (size/oz): 1 gm 2 - Frequency: for the past 6 months use 3 times daily 2 - Last Use / Amount: last night Name of Substance 3: THC 3 - Age of First Use: 15 3 - Frequency: once every 3 to 4 months 3 - Last Use / Amount: last night  Current Place of Residence: [place], [place]   Place of Birth: [place], [place]  Family Members: "My parents and other 5 siblings"  Marital Status:  Single  Children: 0  Sons:  Daughters:  Relationships: Single  Education:  No high school diploma and or GED equivalent  Educational Problems/Performance: Did not complete high school and or obtain  GED.  Religious Beliefs/Practices: None reported  History of Abuse (Emotional/Phsycial/Sexual): Denies any history of abuse.  Occupational Experiences: English as a second language teacher History:  None.  Legal History: Denies any pending legal charges and or problems.  Hobbies/Interests: "I like to get high"  Family History:   Family History  Problem Relation Age of Onset  . Depression Mother   . Anxiety disorder Mother     Results for orders placed during the hospital encounter of 09/26/12 (from the past 72 hour(s))  CBC WITH DIFFERENTIAL     Status: Abnormal   Collection Time    09/26/12  5:05 PM      Result Value Range   WBC 6.5  4.0 - 10.5 K/uL   RBC 4.21  3.87 - 5.11 MIL/uL   Hemoglobin 12.4  12.0 - 15.0 g/dL   HCT 95.6  21.3 - 08.6 %   MCV 92.4  78.0 - 100.0 fL   MCH 29.5  26.0 - 34.0 pg   MCHC 31.9  30.0 - 36.0 g/dL   RDW 57.8  46.9 - 62.9 %   Platelets 416 (*) 150 - 400 K/uL   Neutrophils Relative % 54  43 - 77 %   Neutro Abs 3.5  1.7 - 7.7 K/uL   Lymphocytes Relative 35  12 - 46 %   Lymphs Abs 2.2  0.7 - 4.0 K/uL   Monocytes Relative 10  3 - 12 %   Monocytes Absolute 0.7  0.1 - 1.0 K/uL   Eosinophils Relative 1  0 - 5 %   Eosinophils Absolute 0.0  0.0 - 0.7 K/uL   Basophils Relative 0  0 - 1 %   Basophils Absolute 0.0  0.0 - 0.1 K/uL  COMPREHENSIVE METABOLIC PANEL     Status: None   Collection Time    09/26/12  5:05 PM      Result Value Range   Sodium 137  135 - 145 mEq/L   Potassium 3.7  3.5 - 5.1 mEq/L   Chloride 101  96 - 112 mEq/L   CO2 27  19 - 32 mEq/L   Glucose, Bld 94  70 - 99 mg/dL   BUN 7  6 - 23 mg/dL   Creatinine, Ser 5.28  0.50 - 1.10 mg/dL   Calcium 9.8  8.4 - 41.3 mg/dL   Total Protein 8.0  6.0 - 8.3 g/dL   Albumin 4.2  3.5 - 5.2 g/dL   AST 21  0 - 37 U/L   ALT 12  0 - 35 U/L   Alkaline Phosphatase 74  39 - 117 U/L   Total Bilirubin 0.3  0.3 - 1.2 mg/dL   GFR calc non Af Amer >90  >90 mL/min   GFR calc Af Amer >90  >90 mL/min  Comment:  (NOTE)     The eGFR has been calculated using the CKD EPI equation.     This calculation has not been validated in all clinical situations.     eGFR's persistently <90 mL/min signify possible Chronic Kidney     Disease.  URINE RAPID DRUG SCREEN (HOSP PERFORMED)     Status: Abnormal   Collection Time    09/26/12  5:05 PM      Result Value Range   Opiates POSITIVE (*) NONE DETECTED   Cocaine NONE DETECTED  NONE DETECTED   Benzodiazepines POSITIVE (*) NONE DETECTED   Amphetamines NONE DETECTED  NONE DETECTED   Tetrahydrocannabinol POSITIVE (*) NONE DETECTED   Barbiturates NONE DETECTED  NONE DETECTED   Comment:            DRUG SCREEN FOR MEDICAL PURPOSES     ONLY.  IF CONFIRMATION IS NEEDED     FOR ANY PURPOSE, NOTIFY LAB     WITHIN 5 DAYS.                LOWEST DETECTABLE LIMITS     FOR URINE DRUG SCREEN     Drug Class       Cutoff (ng/mL)     Amphetamine      1000     Barbiturate      200     Benzodiazepine   200     Tricyclics       300     Opiates          300     Cocaine          300     THC              50  ETHANOL     Status: None   Collection Time    09/26/12  5:05 PM      Result Value Range   Alcohol, Ethyl (B) <11  0 - 11 mg/dL   Comment:            LOWEST DETECTABLE LIMIT FOR     SERUM ALCOHOL IS 11 mg/dL     FOR MEDICAL PURPOSES ONLY  POCT PREGNANCY, URINE     Status: None   Collection Time    09/26/12  5:16 PM      Result Value Range   Preg Test, Ur NEGATIVE  NEGATIVE   Comment:            THE SENSITIVITY OF THIS     METHODOLOGY IS >24 mIU/mL   Psychological Evaluations:  Assessment:   AXIS I:  Opioid dependence, Benzodiazepine dependence, Cannabis abuse, Major depressive disorder, recurrent episodes AXIS II:  Deferred AXIS III:   Past Medical History  Diagnosis Date  . Asthma   . Depression   . Anxiety   . Headache(784.0)    AXIS IV:  economic problems, educational problems, occupational problems, other psychosocial or environmental problems  and Polysubstance abuse/dependence AXIS V:  1-10 persistent dangerousness to self and others present  Treatment Plan/Recommendations: 1. Admit for crisis management and stabilization, estimated length of stay 3-5 days.  2. Medication management to reduce current symptoms to base line and improve the patient's overall level of functioning; (a). Continue detoxification treatment protocol already in progress.                                 (b). Discharge planning to include  residential treatment facility. 3. Treat health problems as indicated.  4. Develop treatment plan to decrease risk of relapse upon discharge and the need for readmission.  5. Psycho-social education regarding relapse prevention and self care.  6. Health care follow up as needed for medical problems.  7. Review, reconcile, and reinstate any pertinent home medications for other health issues where appropriate. 8. Call for consults with hospitalist for any additional specialty patient care services as needed.  Treatment Plan Summary: Daily contact with patient to assess and evaluate symptoms and progress in treatment Medication management Supportive approach/coping skills/relapse prevention Reassess and address the  Co morbidities Current Medications:  Current Facility-Administered Medications  Medication Dose Route Frequency Provider Last Rate Last Dose  . acetaminophen (TYLENOL) tablet 650 mg  650 mg Oral Q6H PRN Kerry Hough, PA-C   650 mg at 09/27/12 1009  . alum & mag hydroxide-simeth (MAALOX/MYLANTA) 200-200-20 MG/5ML suspension 30 mL  30 mL Oral Q4H PRN Kerry Hough, PA-C      . chlordiazePOXIDE (LIBRIUM) capsule 25 mg  25 mg Oral QID PRN Kerry Hough, PA-C      . cloNIDine (CATAPRES) tablet 0.1 mg  0.1 mg Oral QID Kerry Hough, PA-C   0.1 mg at 09/27/12 0839   Followed by  . [START ON 09/29/2012] cloNIDine (CATAPRES) tablet 0.1 mg  0.1 mg Oral BH-qamhs Spencer E Simon, PA-C       Followed by  . [START ON  10/02/2012] cloNIDine (CATAPRES) tablet 0.1 mg  0.1 mg Oral QAC breakfast Kerry Hough, PA-C      . dicyclomine (BENTYL) tablet 20 mg  20 mg Oral Q6H PRN Kerry Hough, PA-C   20 mg at 09/27/12 1008  . hydrOXYzine (ATARAX/VISTARIL) tablet 25 mg  25 mg Oral Q6H PRN Kerry Hough, PA-C   25 mg at 09/27/12 1009  . loperamide (IMODIUM) capsule 2-4 mg  2-4 mg Oral PRN Kerry Hough, PA-C      . magnesium hydroxide (MILK OF MAGNESIA) suspension 30 mL  30 mL Oral Daily PRN Kerry Hough, PA-C      . methocarbamol (ROBAXIN) tablet 500 mg  500 mg Oral Q8H PRN Kerry Hough, PA-C      . naproxen (NAPROSYN) tablet 500 mg  500 mg Oral BID PRN Kerry Hough, PA-C      . ondansetron (ZOFRAN-ODT) disintegrating tablet 4 mg  4 mg Oral Q6H PRN Kerry Hough, PA-C      . traZODone (DESYREL) tablet 50 mg  50 mg Oral QHS,MR X 1 Spencer E Simon, PA-C   50 mg at 09/27/12 0050    Observation Level/Precautions:  15 minute checks Seizure  Laboratory:  Reviewed ED lab findings on file  Psychotherapy:  Group sessions  Medications:  See medication lists  Consultations: As needed   Discharge Concerns:  Maintaining sobriety  Estimated LOS: 3-5 days  Other:     I certify that inpatient services furnished can reasonably be expected to improve the patient's condition.   Sanjuana Kava, PMHNP-BC, FNP 8/19/201410:20 AM Agree with assessment and plan Reymundo Poll. Dub Mikes, M.D.

## 2012-09-27 NOTE — [facility] Suicide Risk Assessment (Signed)
[facility] INPATIENT: Family/Significant Other Suicide Prevention Education  Suicide Prevention Education:  Education Completed; No one has been identified by the patient as the family member/significant other with whom the patient will be residing, and identified as the person(s) who will aid the patient in the event of a mental health crisis (suicidal ideations/suicide attempt).   Pt did not c/o SI at admission, nor have they endorsed SI during their stay here. SPE not required. SPI pamphlet given to pt and pt encouraged to ask questions/talk about concerns relating to SPE.    [employer], LCSWA 09/27/2012 11:51 AM

## 2012-09-27 NOTE — Progress Notes (Signed)
Patient ID: Shelby Branch, female   DOB: February 24, 1990, 22 y.o.   MRN: [id number]  Pt was pleasant and cooperative, but flat and depressed. Pt was vol requesting detox from heroin, cocaine, THC and xanax.  Stated she's been using heroin daily for the past 6 months, with cocaine occasionally. Pt also states she abuses xanax taking more than her prescribed amount. Stated she attempted to detox off xanax apprx 1.5 yrs ago at home, and experienced a seizure.  When asked what made her decide to detox now, pt stated, her brother recently lost custody of his kids. Stated her brother decided to get help, so pt raised her hand stating that she also needed help.  Pt denies SI, HI, A/V.

## 2012-09-27 NOTE — Progress Notes (Signed)
Patient ID: Shelby Branch, female   DOB: 1990/04/22, 22 y.o.   MRN: [id number] D-Reports slept fairly well last night. She refused breakfast and has an upset stomach and stomach cramps but has not had any vomiting or diarrhea. She is complaining of poor energy and concentration. She has been in her bed resting until mid morning.She gives herself a pain level of 5 out of 10. A- Bentyl and Tylenol were given for her complaints of stomach cramps and headache, and general body aches. Also, gave her a hot pack for her stomach after the Bentyl didn't provide much relief. Provided her with two cans of Gatorade for hydration. Provided support.R- Cooperative and pleasant. Interacted minimally in the dayroom with peers. Encouraged to seek staff with concerns and needs.

## 2012-09-27 NOTE — Progress Notes (Signed)
Recreation Therapy Notes  Date: 08.19.2014 Time: 2:30pm Location: [address] Dayroom  Group Topic: Animal Assisted Activities  Goal Area(s) Addresses:  Patient will interact appropriately with dog team.    Behavioral Response: Appropriate, Engaged  Education: Discharge Planning   Education Outcome: Acknowledges understanding   Clinical Observations/Feedback: Dog Team: [employer]. Patient interacted appropriately with dog team, LRT and group members.    Marykay Lex Niko Jakel, LRT/CTRS  Nasia Cannan L 09/27/2012 4:40 PM

## 2012-09-28 DIAGNOSIS — F411 Generalized anxiety disorder: Secondary | ICD-10-CM

## 2012-09-28 LAB — HEPATITIS PANEL, ACUTE
HCV Ab: NEGATIVE
Hep A IgM: NEGATIVE

## 2012-09-28 MED ORDER — CHLORDIAZEPOXIDE HCL 25 MG PO CAPS: 25.0000 mg | ORAL_CAPSULE | Freq: Four times a day (QID) | ORAL | Status: DC

## 2012-09-28 MED ORDER — ENSURE COMPLETE PO LIQD
237.0000 mL | Freq: Two times a day (BID) | ORAL | Status: DC
  Administered 2012-09-28 – 2012-09-30 (×3): 237 mL via ORAL

## 2012-09-28 MED ORDER — CHLORDIAZEPOXIDE HCL 25 MG PO CAPS
25.0000 mg | ORAL_CAPSULE | Freq: Four times a day (QID) | ORAL | Status: DC
  Administered 2012-09-28 – 2012-09-29 (×5): 25 mg via ORAL
  Filled 2012-09-28 (×4): qty 1

## 2012-09-28 NOTE — Progress Notes (Signed)
Adult Psychoeducational Group Note  Date:  09/28/2012 Time:  8:53 PM  Group Topic/Focus:  NA group  Participation Level:  Active  Participation Quality:  Appropriate  Affect:  Appropriate  Cognitive:  Alert  Insight: Appropriate  Engagement in Group:  Engaged  Modes of Intervention:  Discussion  Additional Comments:    Flonnie Hailstone 09/28/2012, 8:53 PM

## 2012-09-28 NOTE — [facility] Group Notes (Signed)
[facility] LCSW Aftercare Discharge Planning Group Note   09/28/2012 9:16 AM  Participation Quality:  Appropriate   Mood/Affect:  Lethargic  Depression Rating:  6  Anxiety Rating:  9  Thoughts of Suicide:  No Will you contract for safety?   NA  Current AVH:  No  Plan for Discharge/Comments:  Synia stated that she slept well last night and talked about her parents' visit yesterday evening. Jamile plans to follow up with Phillip Heal for med management and is interested in CD IOP at [facility]. CSW informed pt that Dewayne Hatch was contacted yesterday and should be coming to visit with her today.   Transportation Means: parents   Supports: family   Smart, [place]

## 2012-09-28 NOTE — Progress Notes (Signed)
INITIAL NUTRITION ASSESSMENT  DOCUMENTATION CODES Per approved criteria  -Severe  malnutrition in the context of social or environmental circumstances   INTERVENTION: 1. Patient educated on the importance of adequate nutrition and was encouraged to consume meals and snacks as desired.  2. Ensure Complete BID, 8 ounces provides 350 kcal, 13 g protein  NUTRITION DIAGNOSIS: Unintentional weight loss related to nausea/vomiting as evidenced by 22% weight loss in 3 months.   Goal: Patient will meet >/=90% of estimated nutrition needs.  Monitor:  PO intake  Reason for Assessment: Malnutrition screening  22 y.o. female   ASSESSMENT: Patient with anxiety and polysubstance abuse. She reports a poor appetite and weight loss for the last three months due to nausea and vomiting from anxiety. She has lost 22% of her UBW over this time.She is eating better at this time, eating small meals. She would like to get healthy again.   Patient meets the criteria for severe MALNUTRITION in the context of social and environmental circumstances with 22% weight loss in 3 months and PO intake <75% of estimated needs.   Height: Ht Readings from Last 1 Encounters:  09/26/12 5\' 4"  (1.626 m)    Weight: Wt Readings from Last 1 Encounters:  09/26/12 122 lb (55.339 kg)    Ideal Body Weight: 120 pounds  % Ideal Body Weight: 102%  Wt Readings from Last 10 Encounters:  09/26/12 122 lb (55.339 kg)  08/27/12 134 lb 12.8 oz (61.145 kg) (61%*, Z = 0.29)  07/21/11 145 lb (65.772 kg) (78%*, Z = 0.77)  03/30/11 143 lb 15.4 oz (65.3 kg) (78%*, Z = 0.77)  08/22/10 139 lb 8 oz (63.277 kg) (75%*, Z = 0.67)  03/19/09 150 lb (68.04 kg) (87%*, Z = 1.11)  12/11/08 145 lb (65.772 kg) (84%*, Z = 0.99)  05/02/08 137 lb 4 oz (62.256 kg) (79%*, Z = 0.81)   * Growth percentiles are based on CDC 2-20 Years data.    Usual Body Weight: 156 pounds  % Usual Body Weight: 78%  BMI:  Body mass index is 20.93 kg/(m^2).  Patient is normal weight.   Estimated Nutritional Needs: Kcal: 25-30 kcal/kg Protein: >1 g/kg Fluid: 1 ml/kcal  Diet Order: General  Labs and medications reviewed.   Linnell Fulling, RD, LDN Pager #: [phone] After-Hours Pager #: [phone]

## 2012-09-28 NOTE — Progress Notes (Signed)
Adult Psychoeducational Group Note  Date:  09/28/2012 Time:  12:01 PM  Group Topic/Focus:  Personal Choices and Values:   The focus of this group is to help patients assess and explore the importance of values in their lives, how their values affect their decisions, how they express their values and what opposes their expression.  Participation Level:  Minimal  Participation Quality:  Attentive  Affect:  Appropriate  Cognitive:  Appropriate  Insight: Good  Engagement in Group:  Poor  Modes of Intervention:  Discussion, Education and Socialization  Additional Comments:    [name] 09/28/2012, 12:01 PM

## 2012-09-28 NOTE — [facility] Group Notes (Signed)
[facility] LCSW Group Therapy  09/28/2012 2:32 PM  Type of Therapy:  Group Therapy  Participation Level:  Minimal  Participation Quality:  Drowsy  Affect:  Lethargic  Cognitive:  Lacking  Insight:  Limited  Engagement in Therapy:  Limited  Modes of Intervention:  Discussion, Education, Exploration, Socialization and Support  Summary of Progress/Problems: Emotion Regulation: This group focused on both positive and negative emotion identification and allowed group members to process ways to identify feelings, regulate negative emotions, and find healthy ways to manage internal/external emotions. Group members were asked to reflect on a time when their reaction to an emotion led to a negative outcome and explored how alternative responses using emotion regulation would have benefited them. Group members were also asked to discuss a time when emotion regulation was utilized when a negative emotion was experienced. Shelby Branch was inattentive and disengaged throughout the majority of today's group. She presented with pleasant mood but with lethargic affect, and quickly fell asleep. Shelby Branch was able to identify "hopless" as her primary emotion felt upon coming to [facility]. She was able to describe how this emotion felt to her on a physical and mental level. Shelby Branch also stated that she tends to isolate from family and friends due to shame and guilt involved in feeling depressed and hiding her substance abuse. Shelby Branch fell asleep after this and did not wake up until the end of group. Shelby Branch shows limited progress in the group setting AEB her inability to maintain focus and stay awake. She stated that her medications have been making her sleepy during the day.    Smart, Chalmer Zheng 09/28/2012, 2:32 PM

## 2012-09-28 NOTE — Progress Notes (Signed)
[facility] MD Progress Note  09/28/2012 3:48 PM KERRINGTON SOVA  MRN:  [id number] Subjective:  Elfrida endorses that she is having a hard time. Still very anxious. She has had seizures coming off the Xanax. She has been using 5-6 mg of Xanax every day. Her pulse fluctuates put the BP has staid low probably because of the Clonidine. 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  HENT: Negative.   Eyes: Negative.   Respiratory: Negative.   Cardiovascular: Negative.   Gastrointestinal: Negative.   Genitourinary: Negative.   Musculoskeletal: Negative.   Skin: Negative.   Neurological: Positive for weakness.  Endo/Heme/Allergies: Negative.   Psychiatric/Behavioral: Positive for substance abuse. The patient is nervous/anxious and has insomnia.     Blood pressure 103/72, pulse 80, temperature 96.7 F (35.9 C), temperature source Oral, resp. rate 20, height 5\' 4"  (1.626 m), weight 55.339 kg (122 lb), last menstrual period 06/27/2012.Body mass index is 20.93 kg/(m^2).  General Appearance: Disheveled  Eye Solicitor::  Fair  Speech:  Clear and Coherent and Slow  Volume:  Decreased  Mood:  Anxious  Affect:  anxious, worried  Thought Process:  Coherent and Goal Directed  Orientation:  Full (Time, Place, and  Person)  Thought Content:  worries, concerns afraid of having a seizure  Suicidal Thoughts:  No  Homicidal Thoughts:  No  Memory:  Immediate;   Fair Recent;   Fair Remote;   Fair  Judgement:  Fair  Insight:  Present  Psychomotor Activity:  Restlessness  Concentration:  Fair  Recall:  Fair  Akathisia:  No  Handed:  Right  AIMS (if indicated):     Assets:  Desire for Improvement Housing Social Support  Sleep:  Number of Hours: 6.75   Current Medications: Current Facility-Administered Medications  Medication Dose Route Frequency Provider Last Rate Last Dose  . acetaminophen (TYLENOL) tablet 650 mg  650 mg Oral Q6H PRN Kerry Hough, PA-C   650 mg at 09/27/12 1009  . alum & mag hydroxide-simeth (MAALOX/MYLANTA) 200-200-20 MG/5ML suspension 30 mL  30 mL Oral Q4H PRN Kerry Hough, PA-C      . chlordiazePOXIDE (LIBRIUM) capsule 25 mg  25 mg Oral QID PRN Kerry Hough, PA-C      . chlordiazePOXIDE (LIBRIUM) capsule 25 mg  25 mg Oral QID Rachael Fee, MD   25 mg at 09/28/12 1525  . cloNIDine (CATAPRES) tablet 0.1 mg  0.1 mg Oral QID Kerry Hough, PA-C   0.1 mg at 09/28/12 1304   Followed by  . [START ON 09/29/2012] cloNIDine (CATAPRES) tablet 0.1 mg  0.1 mg Oral BH-qamhs Spencer E Simon, PA-C       Followed by  . [START ON 10/02/2012] cloNIDine (CATAPRES) tablet 0.1 mg  0.1 mg Oral QAC breakfast Kerry Hough, PA-C      . dicyclomine (BENTYL) tablet 20 mg  20  mg Oral Q6H PRN Kerry Hough, PA-C   20 mg at 09/27/12 1008  . feeding supplement (ENSURE COMPLETE) liquid 237 mL  237 mL Oral BID BM Elyse A Shearer, RD   237 mL at 09/28/12 1400  . hydrOXYzine (ATARAX/VISTARIL) tablet 25 mg  25 mg Oral Q6H PRN Kerry Hough, PA-C   25 mg at 09/27/12 1009  . loperamide (IMODIUM) capsule 2-4 mg  2-4 mg Oral PRN Kerry Hough, PA-C      . magnesium hydroxide (MILK OF MAGNESIA) suspension 30 mL  30 mL Oral Daily PRN Kerry Hough, PA-C      . methocarbamol (ROBAXIN) tablet 500 mg   500 mg Oral Q8H PRN Kerry Hough, PA-C      . naproxen (NAPROSYN) tablet 500 mg  500 mg Oral BID PRN Kerry Hough, PA-C      . ondansetron (ZOFRAN-ODT) disintegrating tablet 4 mg  4 mg Oral Q6H PRN Kerry Hough, PA-C      . traZODone (DESYREL) tablet 50 mg  50 mg Oral QHS,MR X 1 Kerry Hough, PA-C   50 mg at 09/27/12 2140    Lab Results:  Results for orders placed during the hospital encounter of 09/26/12 (from the past 48 hour(s))  HEPATITIS PANEL, ACUTE     Status: None   Collection Time    09/27/12  7:55 PM      Result Value Range   Hepatitis B Surface Ag NEGATIVE  NEGATIVE   HCV Ab NEGATIVE  NEGATIVE   Comment: Performed at [facility]   Hep A IgM PENDING  NEGATIVE   Hep B C IgM PENDING  NEGATIVE    Physical Findings: AIMS: Facial and Oral Movements Muscles of Facial Expression: None, normal Lips and Perioral Area: None, normal Jaw: None, normal Tongue: None, normal,Extremity Movements Upper (arms, wrists, hands, fingers): None, normal Lower (legs, knees, ankles, toes): None, normal, Trunk Movements Neck, shoulders, hips: None, normal, Overall Severity Severity of abnormal movements (highest score from questions above): None, normal Incapacitation due to abnormal movements: None, normal Patient's awareness of abnormal movements (rate only patient's report): No Awareness, Dental Status Current problems with teeth and/or dentures?: No Does patient usually wear dentures?: No  CIWA:  CIWA-Ar Total: 1 COWS:  COWS Total Score: 5  Treatment Plan Summary: Daily contact with patient to assess and evaluate symptoms and progress in treatment Medication management  Plan: Supportive approach/coping skills/relapse prevention           Reassess and address the co morbidites  Medical Decision Making Problem Points:  Review of psycho-social stressors (1) Data Points:  Review of new medications or change in dosage (2)  I certify that inpatient services furnished  can reasonably be expected to improve the patient's condition.   Callie Bunyard A 09/28/2012, 3:48 PM

## 2012-09-28 NOTE — Progress Notes (Signed)
D: Patient denies SI/HI and A/V hallucinations; heart rate is elevated  A: Monitored q 15 minutes; patient encouraged to attend groups; patient educated about medications; patient given medications per physician orders; patient encouraged to express feelings and/or concerns  R: Patient is appropriate and flat and blunted;  patient is taking medications as prescribed and tolerating medications; patient is attending all groups and engaging

## 2012-09-28 NOTE — Tx Team (Signed)
Interdisciplinary Treatment Plan Update (Adult)  Date: 09/28/2012   Time Reviewed: 10:48 AM  Progress in Treatment:  Attending groups: yes Participating in groups:  Yes Taking medication as prescribed: Yes  Tolerating medication: Yes  Family/Significant othe contact made: Not needed. SPE not required for this pt.   Patient understands diagnosis: Yes, AEB seeking treatment for heroin addiction/detox, anxiety, and depression.  Discussing patient identified problems/goals with staff: Yes  Medical problems stabilized or resolved: Yes  Denies suicidal/homicidal ideation: Yes  Patient has not harmed self or Others: Yes  New problem(s) identified:  Discharge Plan or Barriers: Pt plans to continue seeing Dr. Madaline Guthrie for med management and is interested in CDIOP with [facility] o/p. Ann contacted and will likely visit pt today.  Additional comments: This is a 22 year old Caucasian female. Admitted to [facility] from the [facility] ED with complaints of increased anxiety, compulsive thoughts and a request for drug detoxification treatment. Patient reports, "My brother took me to the [facility] yesterday. He is an addict too. He wanted to go to the hospital to get some help for his substance addiction. And I said to him that I needed help for my own addiction too. I need detoxification treatment for heroin addiction/dependence. I have been abusing Marijuana, cocaine and Xanax too. Xanax was prescribed to me by my psychiatrist, Dr. Phillip Heal. I have been abusing Heroin since the last 6-7 months daily. Heroin gets me high. Getting high takes all my pain away. I have financial problems, and I'm alienated from family because of my drug use. It hurts to know that I have lost all the people that I cared about, that should be in my life. I did not finish high school and or has GED. I use drugs mostly to stop me from going into withdrawals. It is a horrible feeling to be withdrawing from these stuff. I have  had seizures, the latest was last month. I had passed out, I mean practically gone, until some gave me a shot of Narcan. I need help. I'm going through some tough time right now; cold sweats, cold chills, hot flashes, muscle cramps, abdominal cramps, etc". Reason for Continuation of Hospitalization: Clonidine taper-withdrawals Mood stabilization Medication management  Estimated length of stay: 2-3 days  For review of initial/current patient goals, please see plan of care.  Attendees:  Patient:    Family:    Physician: Geoffery Lyons  09/28/2012 10:49 AM   Nursing: Lupita Leash RN 09/28/2012 10:49 AM  Clinical Social Worker Keavon Sensing Smart, LCSWA  09/28/2012 10:49 AM   Other: Philippa Chester RN 09/28/2012 10:49 AM   Other: Darden Dates Nurse CM 09/28/2012 10:49 AM   Other:    Other:    Scribe for Treatment Team:  [employer] LCSWA 09/28/2012 10:51 AM

## 2012-09-29 MED ORDER — CHLORDIAZEPOXIDE HCL 25 MG PO CAPS
25.0000 mg | ORAL_CAPSULE | Freq: Three times a day (TID) | ORAL | Status: DC
Start: 2012-09-29 — End: 2012-09-30
  Administered 2012-09-29 – 2012-09-30 (×3): 25 mg via ORAL
  Filled 2012-09-29 (×3): qty 1

## 2012-09-29 NOTE — Progress Notes (Signed)
D:  Patient up and active in the milieu today.  Has been attending and participating in groups today.  Blood pressure has been running a bit low, but within parameters for receiving scheduled Clonidine.  She rates depression at 6 and hopelessness at 0 today.  She denies suicidal or self harm thoughts.  A:  Medications given as prescribed.  Encouraged participation in all groups.  Offered support and encouragement.  R:  Pleasant and cooperative.  Interacting well with staff and peers.  Tolerating medications well.  Safety is maintained on the unit with 15 minute checks.

## 2012-09-29 NOTE — Progress Notes (Signed)
[facility] MD Progress Note  09/29/2012 5:51 PM Shelby Branch  MRN:  [id number] Subjective:  Continues to be detox. Her pulse rate has gone down with the Librium. Will continue to wean off. She is still wanting to go home and pursue outpatient follow up. States that she has to set clear boundaries with the people who are going to be influence her going back to using Diagnosis:   DSM5: Schizophrenia Disorders:   Obsessive-Compulsive Disorders:   Trauma-Stressor Disorders:   Substance/Addictive Disorders:  Opioid Disorder - Severe (304.00), Benzodiazepine Dependence Depressive Disorders:  Depressive Disorder NOS  Axis I: Anxiety Disorder NOS  ADL's:  Intact  Sleep: Fair  Appetite:  Fair  Suicidal Ideation:  Plan:  denies Intent:  denies Means:  denies Homicidal Ideation:  Plan:  denies Intent:  denies Means:  denies AEB (as evidenced by):  Psychiatric Specialty Exam: Review of Systems  Constitutional: Negative.   HENT: Negative.   Eyes: Negative.   Respiratory: Negative.   Cardiovascular: Negative.   Gastrointestinal: Negative.   Genitourinary: Negative.   Musculoskeletal: Negative.   Skin: Negative.   Neurological: Negative.   Endo/Heme/Allergies: Negative.   Psychiatric/Behavioral: Positive for substance abuse. The patient is nervous/anxious.     Blood pressure 90/59, pulse 96, temperature 97 F (36.1 C), temperature source Oral, resp. rate 16, height 5\' 4"  (1.626 m), weight 55.339 kg (122 lb), last menstrual period 06/27/2012.Body mass index is 20.93 kg/(m^2).  General Appearance: Fairly Groomed  Patent attorney::  Fair  Speech:  Clear and Coherent  Volume:  Decreased  Mood:  Anxious and worried  Affect:  Restricted  Thought Process:  Coherent and Goal Directed  Orientation:  Full (Time, Place, and Person)  Thought Content:  worries, concerns  Suicidal Thoughts:  No  Homicidal Thoughts:  No  Memory:  Immediate;   Fair Recent;   Fair Remote;   Fair  Judgement:  Fair   Insight:  Present  Psychomotor Activity:  Normal  Concentration:  Fair  Recall:  Fair  Akathisia:  No  Handed:  Right  AIMS (if indicated):     Assets:  Desire for Improvement  Sleep:  Number of Hours: 6.25   Current Medications: Current Facility-Administered Medications  Medication Dose Route Frequency Provider Last Rate Last Dose  . acetaminophen (TYLENOL) tablet 650 mg  650 mg Oral Q6H PRN Kerry Hough, PA-C   650 mg at 09/28/12 2310  . alum & mag hydroxide-simeth (MAALOX/MYLANTA) 200-200-20 MG/5ML suspension 30 mL  30 mL Oral Q4H PRN Kerry Hough, PA-C      . chlordiazePOXIDE (LIBRIUM) capsule 25 mg  25 mg Oral QID PRN Kerry Hough, PA-C      . chlordiazePOXIDE (LIBRIUM) capsule 25 mg  25 mg Oral TID Rachael Fee, MD   25 mg at 09/29/12 1720  . cloNIDine (CATAPRES) tablet 0.1 mg  0.1 mg Oral BH-qamhs Kerry Hough, PA-C       Followed by  . [START ON 10/02/2012] cloNIDine (CATAPRES) tablet 0.1 mg  0.1 mg Oral QAC breakfast Kerry Hough, PA-C      . dicyclomine (BENTYL) tablet 20 mg  20 mg Oral Q6H PRN Kerry Hough, PA-C   20 mg at 09/27/12 1008  . feeding supplement (ENSURE COMPLETE) liquid 237 mL  237 mL Oral BID BM Elyse A Shearer, RD   237 mL at 09/29/12 1400  . hydrOXYzine (ATARAX/VISTARIL) tablet 25 mg  25 mg Oral Q6H PRN Kerry Hough, PA-C  25 mg at 09/27/12 1009  . loperamide (IMODIUM) capsule 2-4 mg  2-4 mg Oral PRN Kerry Hough, PA-C      . magnesium hydroxide (MILK OF MAGNESIA) suspension 30 mL  30 mL Oral Daily PRN Kerry Hough, PA-C      . methocarbamol (ROBAXIN) tablet 500 mg  500 mg Oral Q8H PRN Kerry Hough, PA-C      . naproxen (NAPROSYN) tablet 500 mg  500 mg Oral BID PRN Kerry Hough, PA-C      . ondansetron (ZOFRAN-ODT) disintegrating tablet 4 mg  4 mg Oral Q6H PRN Kerry Hough, PA-C      . traZODone (DESYREL) tablet 50 mg  50 mg Oral QHS,MR X 1 Kerry Hough, PA-C   50 mg at 09/28/12 2309    Lab Results:  Results for  orders placed during the hospital encounter of 09/26/12 (from the past 48 hour(s))  HEPATITIS PANEL, ACUTE     Status: None   Collection Time    09/27/12  7:55 PM      Result Value Range   Hepatitis B Surface Ag NEGATIVE  NEGATIVE   HCV Ab NEGATIVE  NEGATIVE   Hep A IgM NEGATIVE  NEGATIVE   Hep B C IgM NEGATIVE  NEGATIVE   Comment: (NOTE)     High levels of Hepatitis B Core IgM antibody are detectable     during the acute stage of Hepatitis B. This antibody is used     to differentiate current from past HBV infection.     Performed at [facility]    Physical Findings: AIMS: Facial and Oral Movements Muscles of Facial Expression: None, normal Lips and Perioral Area: None, normal Jaw: None, normal Tongue: None, normal,Extremity Movements Upper (arms, wrists, hands, fingers): None, normal Lower (legs, knees, ankles, toes): None, normal, Trunk Movements Neck, shoulders, hips: None, normal, Overall Severity Severity of abnormal movements (highest score from questions above): None, normal Incapacitation due to abnormal movements: None, normal Patient's awareness of abnormal movements (rate only patient's report): No Awareness, Dental Status Current problems with teeth and/or dentures?: No Does patient usually wear dentures?: No  CIWA:  CIWA-Ar Total: 1 COWS:  COWS Total Score: 5  Treatment Plan Summary: Daily contact with patient to assess and evaluate symptoms and progress in treatment Medication management  Plan: Supportive approach/coping skills/relapse prevention           Reassess and address the co morbidities  Medical Decision Making Problem Points:  Review of psycho-social stressors (1) Data Points:  Review of medication regiment & side effects (2)  I certify that inpatient services furnished can reasonably be expected to improve the patient's condition.   [name] A 09/29/2012, 5:51 PM

## 2012-09-29 NOTE — Progress Notes (Signed)
Adult Psychoeducational Group Note  Date:  09/29/2012 Time:  12:38 PM  Group Topic/Focus:  Building Self Esteem:   The Focus of this group is helping patients become aware of the effects of self-esteem on their lives, the things they and others do that enhance or undermine their self-esteem, seeing the relationship between their level of self-esteem and the choices they make and learning ways to enhance self-esteem.  Participation Level:  Active  Participation Quality:  Appropriate and Attentive  Affect:  Appropriate  Cognitive:  Alert and Appropriate  Insight: Good  Engagement in Group:  Engaged  Modes of Intervention:  Discussion, Socialization and Support  Additional Comments:  Pt came to group and shared that isolation and her past were negatively effecting her self-esteem. Pt plans on changing this by going to AA/NA meetings and staying clean off of drugs and exercising more to increase her self-esteem.  Cathlean Cower 09/29/2012, 12:38 PM

## 2012-09-29 NOTE — Progress Notes (Signed)
D   Pt reports increased anxiety and that she had difficulty sleeping last night and that she believes she did not get the right amount of medication last night   She reports feeling ready to be discharged and expresses a good amount of insight into maintaining her sobriety    She is pleasant on approach  She denies suicidal and homicidal ideation   She attends and participates in groups    A   Verbal support given  Medications administered and effectiveness monitored   Q 15 min checks R   Pt safe at present

## 2012-09-29 NOTE — [facility] Group Notes (Signed)
[facility] LCSW Group Therapy  09/29/2012 3:00 PM  Type of Therapy:  Group Therapy  Participation Level:  Active  Participation Quality:  Attentive  Affect:  Appropriate  Cognitive:  Alert  Insight:  Improving  Engagement in Therapy:  Engaged  Modes of Intervention:  Discussion, Education, Exploration, Socialization and Support  Summary of Progress/Problems:  Finding Balance in Life. Today's group focused on defining balance in one's own words, identifying things that can knock one off balance, and exploring healthy ways to maintain balance in life. Group members were asked to provide an example of a time when they felt off balance, describe how they handled that situation,and process healthier ways to regain balance in the future. Group members were asked to share the most important tool for maintaining balance that they learned while at [facility] and how they plan to apply this method after discharge. Delara explained that to her, balance means prioritizing what is important in life and getting rid of things that bring her down. Wilda identified Sobriety as synonymous with life balance. She explained that having family members who do not understand her mental illness and addiction tends to make her feel off balance. She identified "therapy, working with animals, and taking medications as prescribed" as ways to reestablish balance in her life. Ambria shows progress in the group setting and improving insight AEB her high level of attentiveness and participation. She was able to connect how recovery/sobriety correlates to balance in life.    Smart, Jaymie Mckiddy 09/29/2012, 3:00 PM

## 2012-09-30 DIAGNOSIS — F320 Major depressive disorder, single episode, mild: Secondary | ICD-10-CM

## 2012-09-30 MED ORDER — TRAZODONE HCL 50 MG PO TABS: 50.0000 mg | ORAL_TABLET | Freq: Every evening | ORAL | Status: DC | PRN

## 2012-09-30 NOTE — Progress Notes (Signed)
[facility] Adult Case Management Discharge Plan :  Will you be returning to the same living situation after discharge: Yes,  home with parents  At discharge, do you have transportation home?:Yes,  parents Do you have the ability to pay for your medications:Yes,  [place] [facility]  Release of information consent forms completed and in the chart;  Patient's signature needed at discharge.  Patient to Follow up at: Follow-up Information   Follow up with [facility] On 10/03/2012. (Appt with Hal Neer at 10:00AM for hospital followup/therapy. )    Contact information:   [address] [place], [place] [address] phone: [phone] fax: [phone]      Follow up with [facility] On 10/28/2012. (Appt. at 3PM with Dr. Madaline Guthrie for medication management. )    Contact information:   [address] [place], [place] [address] phone: [phone] fax: [phone]      Patient denies SI/HI:   Yes,  during intake/group/self report    Safety Planning and Suicide Prevention discussed:  Yes,  pt did not endorse si during admission or stay at [facility]. SPI pamphlet provided to pt and she was encouraged to ask questions and talk about any concerns relating to SPE.  Shelby Branch, Shelby Branch 09/30/2012, 10:53 AM

## 2012-09-30 NOTE — [facility] Suicide Risk Assessment (Signed)
Suicide Risk Assessment  Discharge Assessment     Demographic Factors:  Adolescent or young adult and Caucasian  Mental Status Per Nursing Assessment::   On Admission:  NA  Current Mental Status by Physician: In full contact with reality. There are no suicidal ideas, plans or intent. She states she is feeling really well, clear headed. She is committed to abstinence. She understands that she has to avoid the people who have been influential in her use. She is going to change her phone number so she cant be reached by her supplier. She will have to get out of her way to be able to get the drugs. She is going to stay with her parents and work towards going back to school.   Loss Factors: NA  Historical Factors: Family history of mental illness or substance abuse  Risk Reduction Factors:   Sense of responsibility to family, Living with another person, especially a relative and Positive social support  Continued Clinical Symptoms:  Depression:   Comorbid alcohol abuse/dependence Alcohol/Substance Abuse/Dependencies  Cognitive Features That Contribute To Risk:  Polarized thinking Thought constriction (tunnel vision)    Suicide Risk:  Minimal: No identifiable suicidal ideation.  Patients presenting with no risk factors but with morbid ruminations; may be classified as minimal risk based on the severity of the depressive symptoms  Discharge Diagnoses:   AXIS I:  Opioid Dependence, Anxiety Disorder NOS, Depressive Disorder NOS AXIS II:  Deferred AXIS III:   Past Medical History  Diagnosis Date  . Asthma   . Depression   . Anxiety   . Headache(784.0)    AXIS IV:  other psychosocial or environmental problems AXIS V:  61-70 mild symptoms  Plan Of Care/Follow-up recommendations:  Activity:  as tolerated Diet:  regular Follow up outpatient basis/CD IOP Is patient on multiple antipsychotic therapies at discharge:  No   Has Patient had three or more failed trials of  antipsychotic monotherapy by history:  No  Recommended Plan for Multiple Antipsychotic Therapies: NA  Zachary Lovins A 09/30/2012, 12:36 PM

## 2012-09-30 NOTE — Progress Notes (Signed)
Recreation Therapy Notes   Date: 08.21.2014 Time: 3:00pm Location: [address] Dayroom  Group Topic: Communication, Team Building, Problem Solving  Goal Area(s) Addresses:  Patient will be able to recognize use of communication, team building and problem solving during course of group activity. Patient will verbalize need for communication, team building and problem solving to make group activity successful.  Patient will verbalize benefit of communication, team building and problem solving to post d/c goals.   Behavioral Response: Engaged, Attentive, Appropriate  Intervention: Problem Solving Activity  Activity: Landing Pad. Patients were given 12 plastic drinking straws and a length of masking tape. Working in teams of 2-3, using the provided materials patients were asked to build a freestanding landing pad that would catch a Audiological scientist soccer ball (equivlaent to size of a golf ball) dropped from approximately 4 feet in the air.    Education: Customer service manager, Discharge Planning, Relapse Prevention   Education Outcome: Acknowledges understanding  Clinical Observations/Feedback: Opening discussion included group members defining lifestyle change, group defined lifestyle change as: changes in behavior and routine to encourage and support sobriety and prevent relapse. Patient actively engaged in group activity. Patient was observed to laugh and smile with her teammate, as well as exchange ideas in a healthy way. Patient contributed to wrap up discussion identifying communication and problem solving as a necessary skills needed to make activity a success. Patient additionally verbally agreed with peers as they identified team work as additional skills needed to make activity a success. Patient successfully applied skills used in group session to group definition of lifestyle change, as well as building a support system post d/c.   Shelby Branch, LRT/CTRS  Shelby Branch 09/30/2012  8:21 AM

## 2012-09-30 NOTE — Progress Notes (Signed)
Patient ID: Shelby Branch, female   DOB: 1990-03-13, 22 y.o.   MRN: [id number] Patient discharged per physician order; patient denies SI/HI and A/V hallucinations; patient received samples, prescriptions, and samples after it was reviewed; patient had no other questions or concerns at this time; patient verbalized and signed that she received all belongings; patient left the unit ambulatory

## 2012-09-30 NOTE — Discharge Summary (Signed)
Physician Discharge Summary Note  Patient:  Shelby Branch is an 22 y.o., female MRN:  [id number] DOB:  [birth year] Patient phone:  [phone] (home)  Patient address:   [address] [place] [address],   Date of Admission:  09/26/2012 Date of Discharge: 09/30/12  Reason for Admission:  Opioid detoxification treatrment  Discharge Diagnoses: Active Problems:   Opioid dependence  Review of Systems  Constitutional: Negative.   HENT: Negative.   Eyes: Negative.   Respiratory: Negative.   Cardiovascular: Negative.   Gastrointestinal: Negative.   Genitourinary: Negative.   Musculoskeletal: Negative.   Skin: Negative.   Neurological: Negative.   Endo/Heme/Allergies: Negative.   Psychiatric/Behavioral: Positive for substance abuse (Opioid dependence). Negative for depression, suicidal ideas, hallucinations and memory loss. The patient is nervous/anxious (Stabilized with medication prior to discharge) and has insomnia (Stabilized with medication prior to discharge).     DSM5:  Schizophrenia Disorders:  NA Obsessive-Compulsive Disorders:  NA Trauma-Stressor Disorders:  NA Substance/Addictive Disorders:  Opioid Intoxication (292.89) Depressive Disorders:  Major Depressive Disorder - Mild (296.21)  Axis Diagnosis:   AXIS I:  Opioid intoxication/withdrawal, MDD, mild AXIS II:  Deferred AXIS III:   Past Medical History  Diagnosis Date  . Asthma   . Depression   . Anxiety   . Headache(784.0)    AXIS IV:  other psychosocial or environmental problems and Substance abuse issues AXIS V:  63  Level of Care:  OP  Hospital Course:  This is a 22 year old Caucasian female. Admitted to [facility] from the [facility] ED with complaints of increased anxiety, compulsive thoughts and a request for drug detoxification treatment. Patient reports, "My brother took me to the [facility] yesterday. He is an addict too. He wanted to go to the hospital to get some help for his  substance addiction. And I said to him that I needed help for my own addiction too. I need detoxification treatment for heroin addiction/dependence. I have been abusing Marijuana, cocaine and Xanax too. Xanax was prescribed to me by my psychiatrist, Dr. Phillip Heal. I have been abusing Heroin since the last 6-7 months daily. Heroin gets me high. Getting high takes all my pain away. I have financial problems, and I'm alienated from family because of my drug use. It hurts to know that I have lost all the people that I cared about, that should be in my life. I did not finish high school and or has GED. I use drugs mostly to stop me from going into withdrawals. It is a horrible feeling to be withdrawing from these stuff. I have had seizures, the latest was last month. I had passed out, I mean practically gone, until some gave me a shot of Narcan. I need help. I'm going through some tough time right now; cold sweats, cold chills, hot flashes, muscle cramps, abdominal cramps, etc".  After admission assessment and evaluation, including UDS/toxicology test reports of (+) Benzodiazepines, Cocaine,THC and patient's verbal report of heroin abuse, it was determined that Shelby Branch will need detoxification treatment protocols to stabilize her systems of drug intoxications and to combat the withdrawal symptoms of these substances as well. And her discharge plans included a referral to an outpatient psychiatric clinic for routine psychiatric treatments, medication management and counseling services. Shelby Branch was then started on Librium/clonidine detoxification treatment protocols. She was also enrolled in group counseling sessions and activities where she was counseled, taught and learned coping skills that should help her after discharge to cope  better and manage her substance abuse problems to maintain a much longer sobriety.  Besides the detoxification treatment protocols, patient also received Trazodone  50 mg Q bedtime for  sleep. Shelby Branch maintained throughout her stay in this hospital that she was not depressed. She was also enrolled and attended AA/NA meetings being offered and held on this unit. Shelby Branch presented no other previous and or identifiable medical conditions that required treatment and or monitoring. However, she was monitored closely for any potential problems that may arise as a result of and or during detoxification treatment. Patient tolerated her treatment regimen and detoxification treatment without any significant adverse effects and or reactions reported.  Patient attended treatment team meeting this am and met with the treatment team members. Her reason for admission, present symptoms, substance abuse issues, response to treatment and discharge plans discussed. Patient endorsed that she is doing well and stable for discharge to pursue the next phase of her substance abuse treatment on outpatient basis. It was agreed upon that she will continue psychiatric/substance abuse treatment and counseling services at the [facility] on 10/03/12 at 10:00 am with Hal Neer for counseling sessions and on 10/28/12 with Dr. Madaline Guthrie at 03:00 PM for medication management.The address, dates, times and contact information for this counseling center provided for patient in writing.   Upon discharge, patient adamantly denies suicidal, homicidal ideations, auditory, visual hallucinations, delusional thinking and or withdrawal symptoms. Patient left [facility] with all personal belongings in no apparent distress. She received 4 days worth supply samples of her [facility] discharge medications. Transportation per family.   Consults:  psychiatry  Significant Diagnostic Studies:  labs: CBC with diff, CMP, UDS, Toxicology tests, U/A  Discharge Vitals:   Blood pressure 114/62, pulse 77, temperature 97.4 F (36.3 C), temperature source Oral, resp. rate 18, height 5\' 4"  (1.626 m), weight 55.339 kg (122 lb), last menstrual  period 06/27/2012. Body mass index is 20.93 kg/(m^2). Lab Results:   No results found for this or any previous visit (from the past 72 hour(s)).  Physical Findings: AIMS: Facial and Oral Movements Muscles of Facial Expression: None, normal Lips and Perioral Area: None, normal Jaw: None, normal Tongue: None, normal,Extremity Movements Upper (arms, wrists, hands, fingers): None, normal Lower (legs, knees, ankles, toes): None, normal, Trunk Movements Neck, shoulders, hips: None, normal, Overall Severity Severity of abnormal movements (highest score from questions above): None, normal Incapacitation due to abnormal movements: None, normal Patient's awareness of abnormal movements (rate only patient's report): No Awareness, Dental Status Current problems with teeth and/or dentures?: No Does patient usually wear dentures?: No  CIWA:  CIWA-Ar Total: 0 COWS:  COWS Total Score: 5  Psychiatric Specialty Exam: See Psychiatric Specialty Exam and Suicide Risk Assessment completed by Attending Physician prior to discharge.  Discharge destination:  Home  Is patient on multiple antipsychotic therapies at discharge:  No   Has Patient had three or more failed trials of antipsychotic monotherapy by history:  No  Recommended Plan for Multiple Antipsychotic Therapies: NA      Discharge Orders   Future Orders Complete By Expires   Discharge instructions  As directed    Comments:     Take all your medicines as prescribed. Report any adverse effects and reactions from your medicines to your outpatient provider promptly. Keep all scheduled follow-up appointments as recommended. Do not drink alcohol beverages and or engage in illegal drug use while on prescription medicines. Follow-up with primary care provider for all your other medical issues and  or concerns. In the event of crisis and or worsening symptoms, you may call 911, the crisis hot-line and or go to the nearest ED for  evaluation/treatment.       Medication List    STOP taking these medications       ALPRAZolam 0.5 MG tablet  Commonly known as:  XANAX     amphetamine-dextroamphetamine 30 MG 24 hr capsule  Commonly known as:  ADDERALL XR     clonazePAM 0.5 MG tablet  Commonly known as:  KLONOPIN      TAKE these medications     Indication   traZODone 50 MG tablet  Commonly known as:  DESYREL  Take 1 tablet (50 mg total) by mouth at bedtime and may repeat dose one time if needed. For sleep   Indication:  Trouble Sleeping       Follow-up Information   Follow up with [facility] On 10/03/2012. (Appt with Hal Neer at 10:00AM for hospital followup/therapy. )    Contact information:   [address] [place], [place] [address] phone: [phone] fax: [phone]      Follow up with [facility] On 10/28/2012. (Appt. at 3PM with Dr. Madaline Guthrie for medication management. )    Contact information:   [address] [place], [place] [address] phone: [phone] fax: [phone]     Follow-up recommendations: Activity:  As tolerated Diet: As recommended by your primary care doctor. Keep all scheduled follow-up appointments as recommended.   Continue to work your relapse prevention plan Comments:  Take all your medications as prescribed by your mental healthcare provider. Report any adverse effects and or reactions from your medicines to your outpatient provider promptly. Patient is instructed and cautioned to not engage in alcohol and or illegal drug use while on prescription medicines. In the event of worsening symptoms, patient is instructed to call the crisis hotline, 911 and or go to the nearest ED for appropriate evaluation and treatment of symptoms. Follow-up with your primary care provider for your other medical issues, concerns and or health care needs.   Total Discharge Time:  Greater than 30 minutes.  Signed: Sanjuana Kava,  PMHNP Agree with assessment and plan Reymundo Poll. Dub Mikes, M.D. 10/02/2012, 4:20 PM

## 2012-09-30 NOTE — BHH Group Notes (Signed)
[facility] LCSW Aftercare Discharge Planning Group Note   09/30/2012 9:37 AM  Participation Quality:  Appropriate   Mood/Affect:  Appropriate  Depression Rating:  2  Anxiety Rating:  6  Thoughts of Suicide:  No Will you contract for safety?   NA  Current AVH:  No  Plan for Discharge/Comments:  Pt has appt scheduled with Dr. Madaline Guthrie and therapist. Dewayne Hatch from [facility] coming to visit pt today. Pt scheduled for d/c today.   Transportation Means: parents  Supports: family  [place], [place]

## 2012-09-30 NOTE — Progress Notes (Signed)
Adult Psychoeducational Group Note  Date:  09/30/2012 Time:  1:34 PM  Group Topic/Focus:  Relapse Prevention Planning:   The focus of this group is to define relapse and discuss the need for planning to combat relapse.  Participation Level:  Active  Participation Quality:  Appropriate  Affect:  Appropriate  Cognitive:  Alert and Appropriate  Insight: Appropriate and Good  Engagement in Group:  Engaged  Modes of Intervention:  Discussion, Education and Support  Additional Comments:  Pt actively participated in a group discussion on recovery and relapse prevention. Pt emphasized that recovery is something that must be strived for.   Reinaldo Raddle K 09/30/2012, 1:34 PM

## 2012-10-05 NOTE — Progress Notes (Signed)
Patient Discharge Instructions:  After Visit Summary (AVS):   Faxed to:  10/05/12 Discharge Summary Note:   Faxed to:  10/05/12 Psychiatric Admission Assessment Note:   Faxed to:  10/05/12 Suicide Risk Assessment - Discharge Assessment:   Faxed to:  10/05/12 Faxed/Sent to the Next Level Care provider:  10/05/12 Faxed to [facility] @ [phone]  Jerelene Redden, 10/05/2012, 4:59 PM

## 2013-06-04 ENCOUNTER — Encounter (HOSPITAL_COMMUNITY): Payer: Self-pay | Admitting: Emergency Medicine

## 2013-06-04 ENCOUNTER — Emergency Department (HOSPITAL_COMMUNITY)
Admission: EM | Admit: 2013-06-04 | Discharge: 2013-06-05 | Disposition: A | Discharge status: Home / Self Care | Payer: 59 | Attending: Emergency Medicine | Admitting: Emergency Medicine

## 2013-06-04 DIAGNOSIS — S71109A Unspecified open wound, unspecified thigh, initial encounter: Principal | ICD-10-CM | POA: Insufficient documentation

## 2013-06-04 DIAGNOSIS — J45909 Unspecified asthma, uncomplicated: Secondary | ICD-10-CM | POA: Insufficient documentation

## 2013-06-04 DIAGNOSIS — F17200 Nicotine dependence, unspecified, uncomplicated: Secondary | ICD-10-CM | POA: Insufficient documentation

## 2013-06-04 DIAGNOSIS — F329 Major depressive disorder, single episode, unspecified: Secondary | ICD-10-CM | POA: Insufficient documentation

## 2013-06-04 DIAGNOSIS — Z79899 Other long term (current) drug therapy: Secondary | ICD-10-CM | POA: Insufficient documentation

## 2013-06-04 DIAGNOSIS — S71112A Laceration without foreign body, left thigh, initial encounter: Secondary | ICD-10-CM

## 2013-06-04 DIAGNOSIS — F3289 Other specified depressive episodes: Secondary | ICD-10-CM | POA: Insufficient documentation

## 2013-06-04 DIAGNOSIS — S71009A Unspecified open wound, unspecified hip, initial encounter: Secondary | ICD-10-CM | POA: Insufficient documentation

## 2013-06-04 DIAGNOSIS — F411 Generalized anxiety disorder: Secondary | ICD-10-CM | POA: Insufficient documentation

## 2013-06-04 MED ORDER — IBUPROFEN 800 MG PO TABS: 800.0000 mg | ORAL_TABLET | Freq: Three times a day (TID) | ORAL | Status: DC | PRN
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
## 2013-06-04 NOTE — Discharge Instructions (Signed)
Return here as needed. Steri-strips will fall off on there own.
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
## 2013-06-05 NOTE — ED Notes (Signed)
Dressing applied to pt's wound. Teaching instructions given as well.

## 2013-06-05 NOTE — ED Provider Notes (Signed)
Medical screening examination/treatment/procedure(s) were performed by non-physician practitioner and as supervising physician I was immediately available for consultation/collaboration.   EKG Interpretation None      Lior Cartelli, MD, FACEP   Amauria Younts L Madyx Delfin, MD 06/05/13 0008 

## 2013-06-11 IMAGING — CT CT HEAD W/O CM
1 of 2 series · 16 of 30 positions shown, 20 images · non-contrast
Comparison: 03/29/2011

CLINICAL DATA: Loss of consciousness

CT HEAD WITHOUT CONTRAST
TECHNIQUE: Contiguous axial images were obtained from the base of
the skull through the vertex without contrast.

[Series 3: recon 2: brain · axial · 0.47mm/px · z∈[+151,+291]mm · 16 of 64 slices shown, 20 images]
[im 4/64  brain]
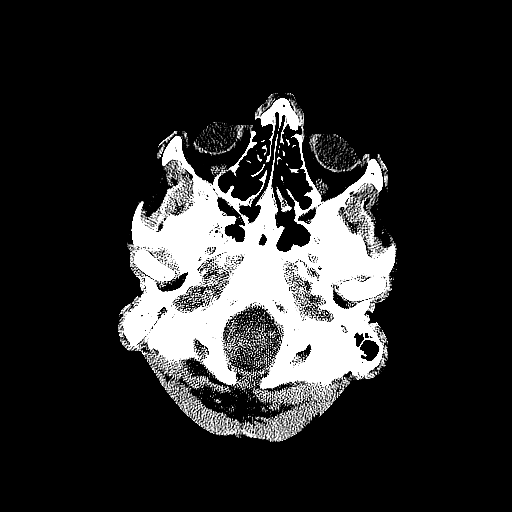
[im 4/64  bone]
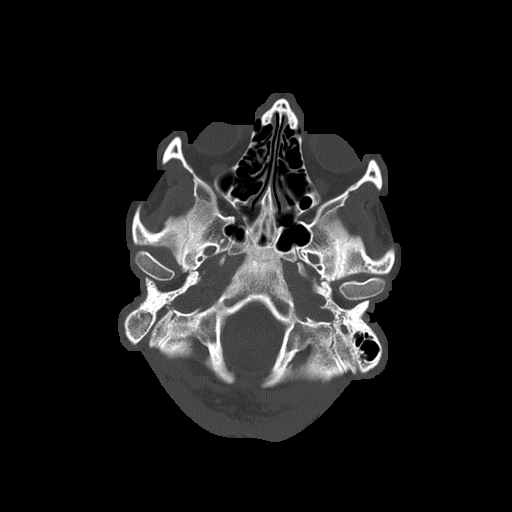
[im 7/64  brain]
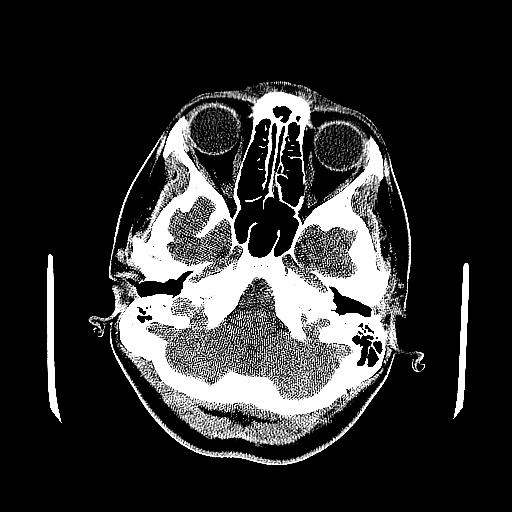
[im 10/64  brain]
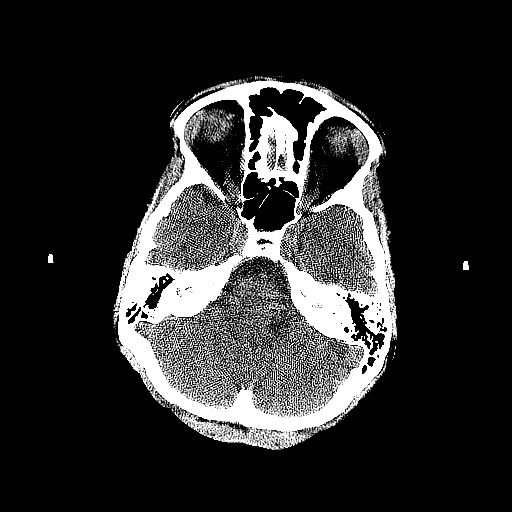
[im 14/64  brain]
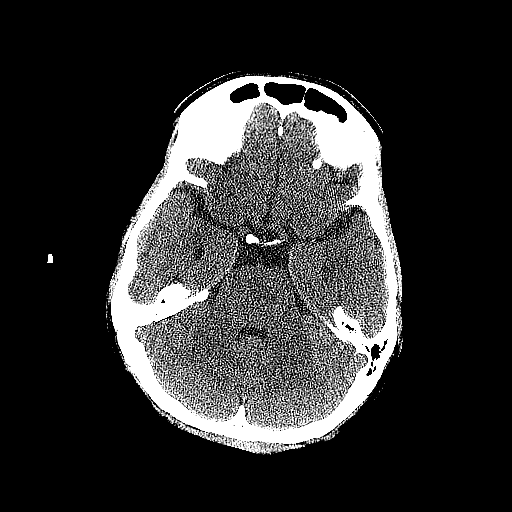
[im 20/64  brain]
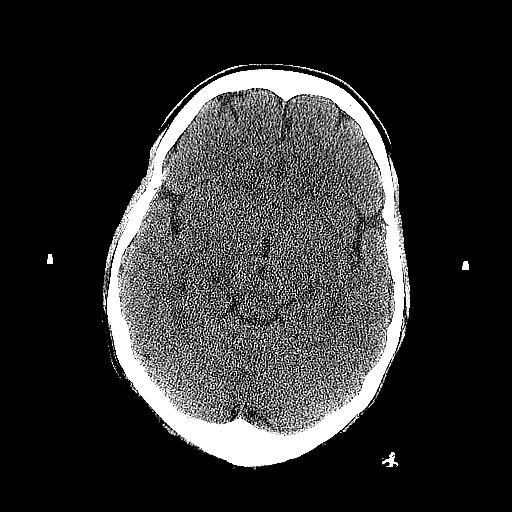
[im 20/64  bone]
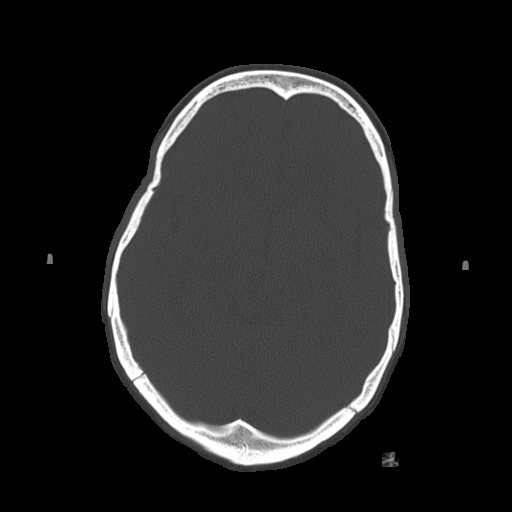
[im 24/64  brain]
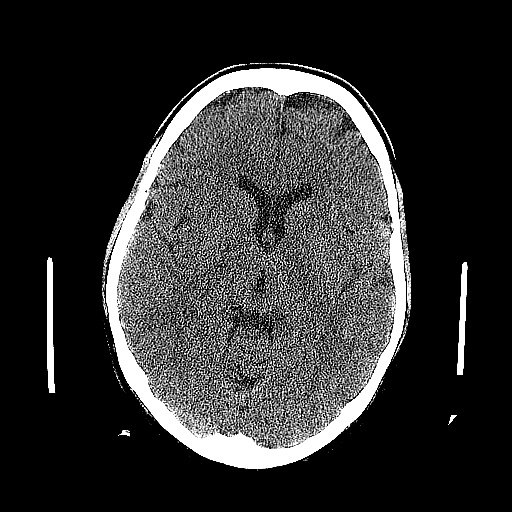
[im 27/64  brain]
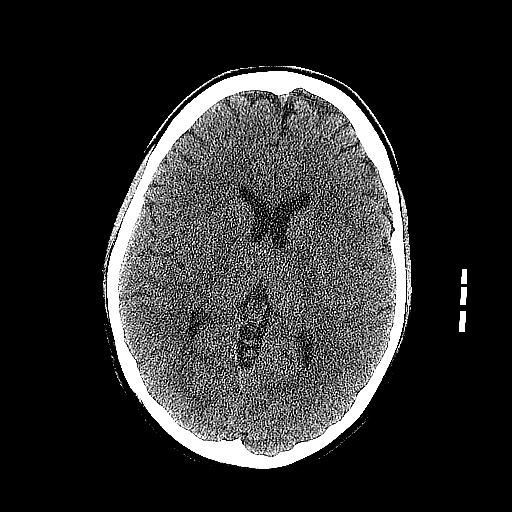
[im 30/64  brain]
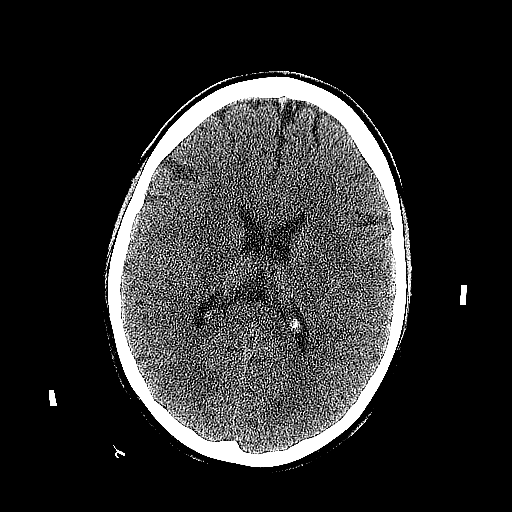
[im 34/64  brain]
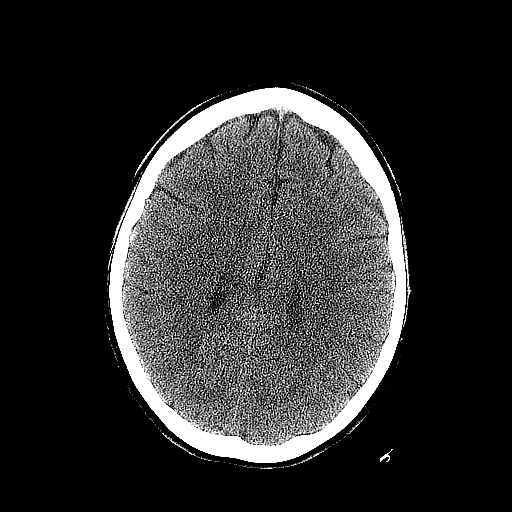
[im 34/64  bone]
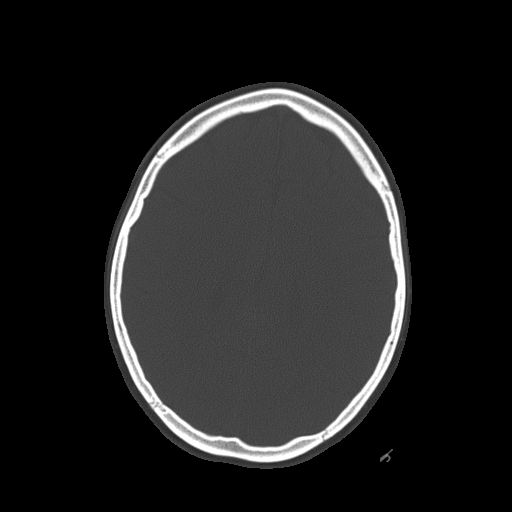
[im 37/64  brain]
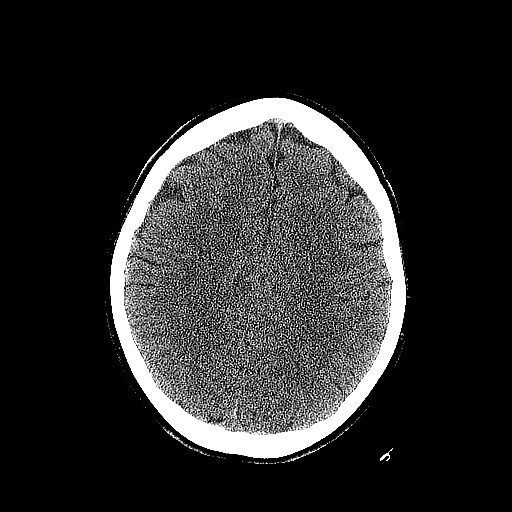
[im 40/64  brain]
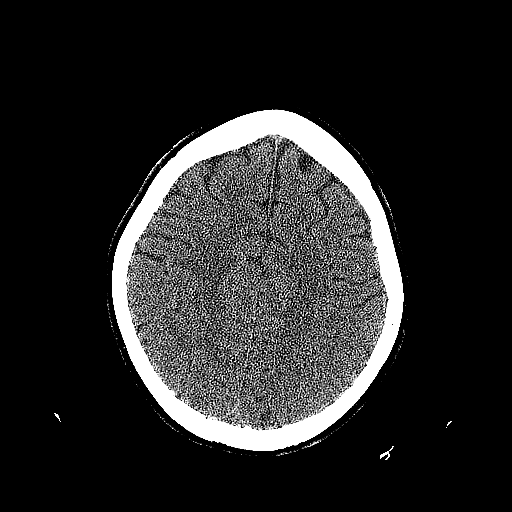
[im 44/64  brain]
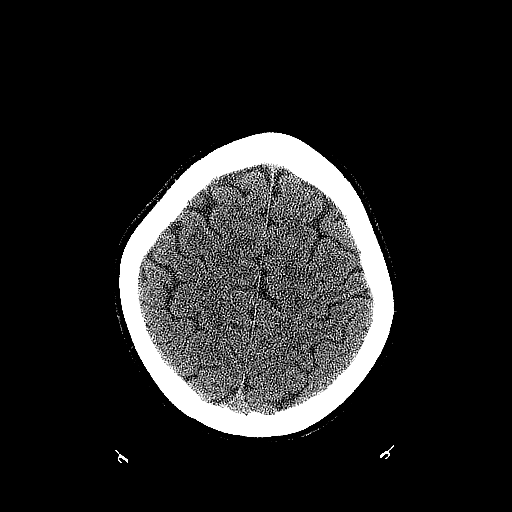
[im 50/64  brain]
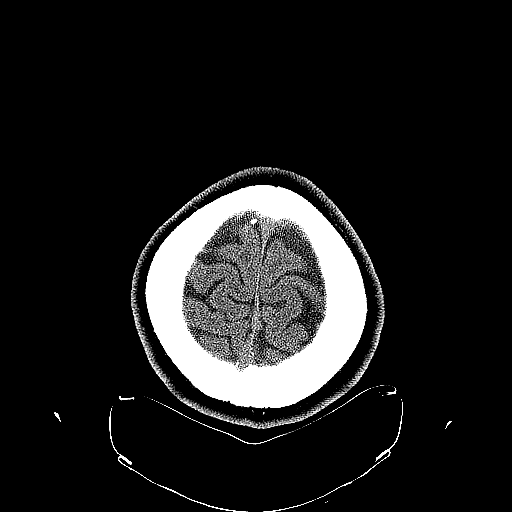
[im 50/64  bone]
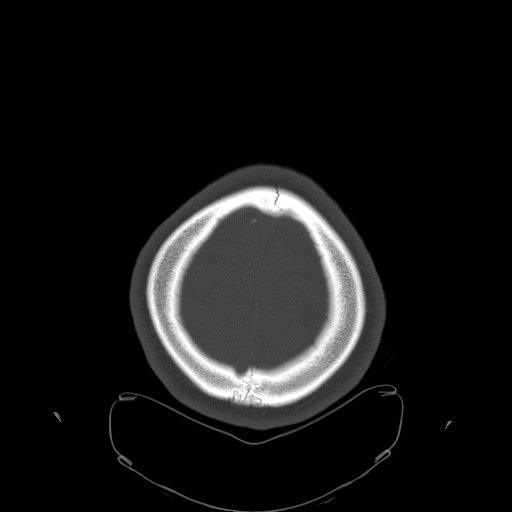
[im 54/64  brain]
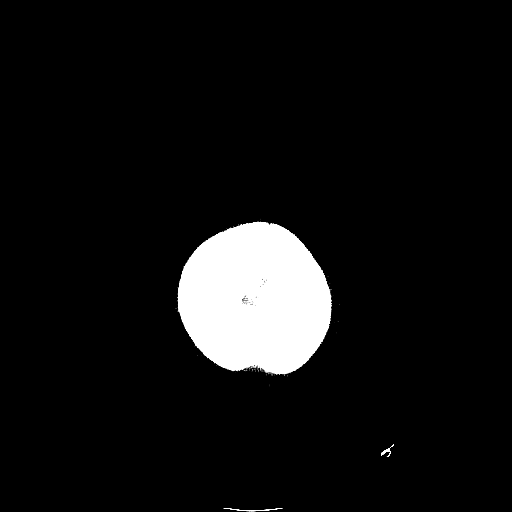
[im 57/64  brain]
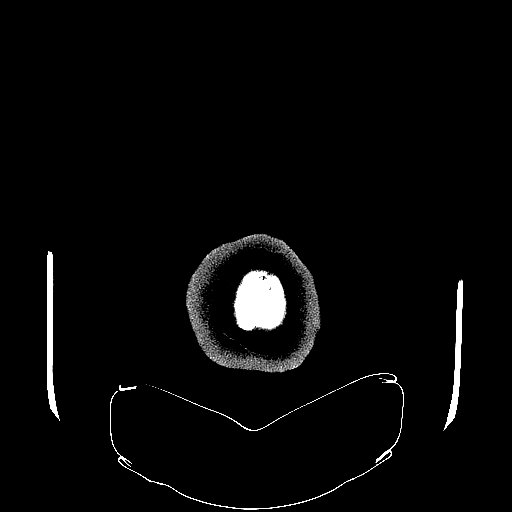
[im 60/64  brain]
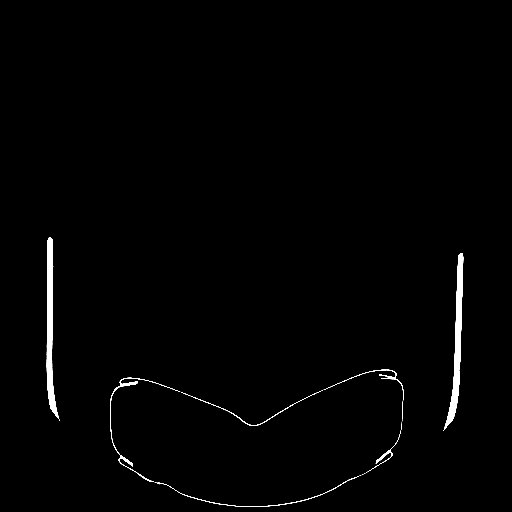

[16 of 30 positions shown; findings below may reference images not displayed]

FINDINGS: There is no evidence for acute hemorrhage, hydrocephalus,
mass lesion, or abnormal extra-axial fluid collection.  No definite
CT evidence for acute infarction.  The visualized paranasal sinuses
and mastoid air cells are predominately clear.
IMPRESSION: No acute intracranial abnormality.

## 2014-02-07 ENCOUNTER — Emergency Department (INDEPENDENT_AMBULATORY_CARE_PROVIDER_SITE_OTHER)
Admission: EM | Admit: 2014-02-07 | Discharge: 2014-02-07 | Disposition: A | Discharge status: Home / Self Care | Payer: 59 | Source: Home / Self Care | Attending: Family Medicine | Admitting: Family Medicine

## 2014-02-07 ENCOUNTER — Encounter (HOSPITAL_COMMUNITY): Payer: Self-pay | Admitting: Emergency Medicine

## 2014-02-07 DIAGNOSIS — B86 Scabies: Secondary | ICD-10-CM

## 2014-02-07 DIAGNOSIS — L739 Follicular disorder, unspecified: Secondary | ICD-10-CM

## 2014-02-07 LAB — RPR

## 2014-02-07 MED ORDER — DOXYCYCLINE HYCLATE 100 MG PO CAPS: 100.0000 mg | ORAL_CAPSULE | Freq: Two times a day (BID) | ORAL | Status: DC

## 2014-02-07 MED ORDER — PERMETHRIN 5 % EX CREA: TOPICAL_CREAM | CUTANEOUS | Status: DC

## 2014-02-07 NOTE — Discharge Instructions (Signed)
If lab results indicate the need for additional treatment, you will be notified by phone.  Folliculitis  Folliculitis is redness, soreness, and swelling (inflammation) of the hair follicles. This condition can occur anywhere on the body. People with weakened immune systems, diabetes, or obesity have a greater risk of getting folliculitis. CAUSES  Bacterial infection. This is the most common cause.  Fungal infection.  Viral infection.  Contact with certain chemicals, especially oils and tars. Long-term folliculitis can result from bacteria that live in the nostrils. The bacteria may trigger multiple outbreaks of folliculitis over time. SYMPTOMS Folliculitis most commonly occurs on the scalp, thighs, legs, back, buttocks, and areas where hair is shaved frequently. An early sign of folliculitis is a small, white or yellow, pus-filled, itchy lesion (pustule). These lesions appear on a red, inflamed follicle. They are usually less than 0.2 inches (5 mm) wide. When there is an infection of the follicle that goes deeper, it becomes a boil or furuncle. A group of closely packed boils creates a larger lesion (carbuncle). Carbuncles tend to occur in hairy, sweaty areas of the body. DIAGNOSIS  Your caregiver can usually tell what is wrong by doing a physical exam. A sample may be taken from one of the lesions and tested in a lab. This can help determine what is causing your folliculitis. TREATMENT  Treatment may include:  Applying warm compresses to the affected areas.  Taking antibiotic medicines orally or applying them to the skin.  Draining the lesions if they contain a large amount of pus or fluid.  Laser hair removal for cases of long-lasting folliculitis. This helps to prevent regrowth of the hair. HOME CARE INSTRUCTIONS  Apply warm compresses to the affected areas as directed by your caregiver.  If antibiotics are prescribed, take them as directed. Finish them even if you start to feel  better.  You may take over-the-counter medicines to relieve itching.  Do not shave irritated skin.  Follow up with your caregiver as directed. SEEK IMMEDIATE MEDICAL CARE IF:   You have increasing redness, swelling, or pain in the affected area.  You have a fever. MAKE SURE YOU:  Understand these instructions.  Will watch your condition.  Will get help right away if you are not doing well or get worse. Document Released: 04/06/2001 Document Revised: 07/28/2011 Document Reviewed: 04/28/2011 [facility] Patient Information 2015 [place], [place]. This information is not intended to replace advice given to you by your health care provider. Make sure you discuss any questions you have with your health care provider.  Scabies Scabies are small bugs (mites) that burrow under the skin and cause red bumps and severe itching. These bugs can only be seen with a microscope. Scabies are highly contagious. They can spread easily from person to person by direct contact. They are also spread through sharing clothing or linens that have the scabies mites living in them. It is not unusual for an entire family to become infected through shared towels, clothing, or bedding.  HOME CARE INSTRUCTIONS   Your caregiver may prescribe a cream or lotion to kill the mites. If cream is prescribed, massage the cream into the entire body from the neck to the bottom of both feet. Also massage the cream into the scalp and face if your child is less than 646 year old. Avoid the eyes and mouth. Do not wash your hands after application.  Leave the cream on for 8 to 12 hours. Your child should bathe or shower after the 8 to  12 hour application period. Sometimes it is helpful to apply the cream to your child right before bedtime.  One treatment is usually effective and will eliminate approximately 95% of infestations. For severe cases, your caregiver may decide to repeat the treatment in 1 week. Everyone in your household should  be treated with one application of the cream.  New rashes or burrows should not appear within 24 to 48 hours after successful treatment. However, the itching and rash may last for 2 to 4 weeks after successful treatment. Your caregiver may prescribe a medicine to help with the itching or to help the rash go away more quickly.  Scabies can live on clothing or linens for up to 3 days. All of your child's recently used clothing, towels, stuffed toys, and bed linens should be washed in hot water and then dried in a dryer for at least 20 minutes on high heat. Items that cannot be washed should be enclosed in a plastic bag for at least 3 days.  To help relieve itching, bathe your child in a cool bath or apply cool washcloths to the affected areas.  Your child may return to school after treatment with the prescribed cream. SEEK MEDICAL CARE IF:   The itching persists longer than 4 weeks after treatment.  The rash spreads or becomes infected. Signs of infection include red blisters or yellow-tan crust. Document Released: 01/26/2005 Document Revised: 04/20/2011 Document Reviewed: 06/06/2008 [facility] Patient Information 2015 [place], [place]. This information is not intended to replace advice given to you by your health care provider. Make sure you discuss any questions you have with your health care provider.

## 2014-02-07 NOTE — ED Provider Notes (Signed)
CSN: [id number]     Arrival date & time 02/07/14  1443 History   First MD Initiated Contact with Patient 02/07/14 1502     Chief Complaint  Patient presents with  . Rash   (Consider location/radiation/quality/duration/timing/severity/associated sxs/prior Treatment) HPI Comments: Patient reports reoccurrence of a rash that she has had in the past. This has been treated by her dermatologist in the past with oral doxycycline. States current episode began 1 month ago. Has not seen her PCP or dermatologist. Reports symptoms began after sleeping at a friend's house for several nights. Rash began on chest and has now spread to torso, back, upper arms. Described as pruritic. Denies known new exposures. Feels otherwise well. Works at grocery store  Patient is a 23 y.o. female presenting with rash. The history is provided by the patient.  Rash Associated symptoms: no fever, no joint pain and no myalgias     Past Medical History  Diagnosis Date  . Asthma   . Depression   . Anxiety   . Headache(784.0)    History reviewed. No pertinent past surgical history. Family History  Problem Relation Age of Onset  . Depression Mother   . Anxiety disorder Mother    History  Substance Use Topics  . Smoking status: Current Some Day Smoker -- 0.25 packs/day for 3 years    Types: Cigarettes  . Smokeless tobacco: Never Used  . Alcohol Use: No   OB History    No data available     Review of Systems  Constitutional: Negative for fever.  HENT: Negative.   Eyes: Negative.   Respiratory: Negative.   Cardiovascular: Negative.   Gastrointestinal: Negative.   Genitourinary: Negative.   Musculoskeletal: Negative for myalgias, back pain, joint swelling and arthralgias.  Skin: Positive for rash. Negative for color change, pallor and wound.  Allergic/Immunologic: Negative for immunocompromised state.    Allergies  Review of patient's allergies indicates no known allergies.  Home Medications   Prior  to Admission medications   Medication Sig Start Date End Date Taking? Authorizing Provider  ALPRAZolam Prudy Feeler(XANAX) 1 MG tablet Take 1 mg by mouth 4 (four) times daily as needed for anxiety.   Yes Historical Provider, MD  amphetamine-dextroamphetamine (ADDERALL XR) 30 MG 24 hr capsule Take 30 mg by mouth 2 (two) times daily.   Yes Historical Provider, MD  doxycycline (VIBRAMYCIN) 100 MG capsule Take 1 capsule (100 mg total) by mouth 2 (two) times daily. 02/07/14   Mathis FareJennifer Lee H Whitni Pasquini, PA  ibuprofen (ADVIL,MOTRIN) 800 MG tablet Take 1 tablet (800 mg total) by mouth every 8 (eight) hours as needed. 06/04/13   Jamesetta Orleanshristopher W Lawyer, PA-C  permethrin (ELIMITE) 5 % cream Apply to affected area once, from chin to toes. Leave 8 hours then shower.  Repeat treatment in one week. 02/07/14   Mathis FareJennifer Lee H Myriah Boggus, PA  sertraline (ZOLOFT) 100 MG tablet Take 100 mg by mouth daily.    Historical Provider, MD  traZODone (DESYREL) 50 MG tablet Take 1 tablet (50 mg total) by mouth at bedtime and may repeat dose one time if needed. For sleep 09/30/12   Sanjuana KavaAgnes I Nwoko, NP   BP 99/64 mmHg  Pulse 84  Temp(Src) 98.1 F (36.7 C) (Oral)  Resp 16  SpO2 95%  LMP 11/02/2013 Physical Exam  Constitutional: She is oriented to person, place, and time. She appears well-developed and well-nourished. No distress.  HENT:  Head: Normocephalic and atraumatic.  Right Ear: Hearing and external ear normal.  Left  Ear: Hearing and external ear normal.  Nose: Nose normal.  Mouth/Throat: Uvula is midline and oropharynx is clear and moist. No oral lesions.  Eyes: Conjunctivae are normal. No scleral icterus.  Neck: Normal range of motion. Neck supple.  Cardiovascular: Normal rate, regular rhythm and normal heart sounds.   Pulmonary/Chest: Effort normal and breath sounds normal. No respiratory distress. She has no wheezes.  Musculoskeletal: Normal range of motion.  Lymphadenopathy:    She has no cervical adenopathy.  Neurological: She  is alert and oriented to person, place, and time.  Skin: Skin is warm and dry. Rash noted.  Diffuse discrete 1-2 mm papular erythematous lesions, many with superficial excoriations on torso and upper extremities   Psychiatric: She has a normal mood and affect. Her behavior is normal.  Nursing note and vitals reviewed.   ED Course  Procedures (including critical care time) Labs Review Labs Reviewed  RPR    Imaging Review No results found.   MDM   1. Folliculitis   2. Scabies   RPR pend. Will treat for both folliculitis and scabies as patient reports the thinks members of household where she stayed have same rash. Advised to follow up with her dermatologist or PCP if no improvement.     Ria ClockJennifer Lee H Dezaria Methot, GeorgiaPA 02/07/14 [phone]

## 2014-02-07 NOTE — ED Notes (Signed)
C/o rash onset x1 month Rash on face, chest, arms, abd, back, legs, buttocks Has been applying OTC topical ointments and left over amox  Alert, no signs of acute distress.

## 2014-09-07 ENCOUNTER — Encounter: Payer: Self-pay | Admitting: Emergency Medicine

## 2014-09-07 ENCOUNTER — Emergency Department
Admission: EM | Admit: 2014-09-07 | Discharge: 2014-09-07 | Disposition: A | Discharge status: Home / Self Care | Payer: 59 | Attending: Emergency Medicine | Admitting: Emergency Medicine

## 2014-09-07 DIAGNOSIS — Z792 Long term (current) use of antibiotics: Secondary | ICD-10-CM | POA: Diagnosis not present

## 2014-09-07 DIAGNOSIS — Z720 Tobacco use: Secondary | ICD-10-CM | POA: Diagnosis not present

## 2014-09-07 DIAGNOSIS — L02414 Cutaneous abscess of left upper limb: Secondary | ICD-10-CM | POA: Insufficient documentation

## 2014-09-07 DIAGNOSIS — Z79899 Other long term (current) drug therapy: Secondary | ICD-10-CM | POA: Diagnosis not present

## 2014-09-07 MED ORDER — IBUPROFEN 800 MG PO TABS: 800.0000 mg | ORAL_TABLET | Freq: Three times a day (TID) | ORAL | Status: DC | PRN

## 2014-09-07 MED ORDER — SULFAMETHOXAZOLE-TRIMETHOPRIM 800-160 MG PO TABS: 1.0000 | ORAL_TABLET | Freq: Two times a day (BID) | ORAL | Status: DC

## 2014-09-07 MED ORDER — HYDROCODONE-ACETAMINOPHEN 5-325 MG PO TABS: 1.0000 | ORAL_TABLET | ORAL | Status: DC | PRN

## 2014-09-07 NOTE — ED Provider Notes (Signed)
[facility] Emergency Department Provider Note  ____________________________________________  Time seen: Approximately 9:14 PM  I have reviewed the triage vital signs and the nursing notes.   HISTORY  Chief Complaint Abscess    HPI Shelby Branch is a 24 y.o. female presents for evaluation of abscess to the left anterior forearm. Patient states it's been there for about 3-4 days ago and has progressively gotten bigger. She admits to trying to squeeze it about a week ago. Patient reports fever yesterday but none today.   Past Medical History  Diagnosis Date  . Asthma   . Depression   . Anxiety   . ZOXWRUEA(540.9)     Patient Active Problem List   Diagnosis Date Noted  . Opioid dependence 09/27/2012  . Seizure 03/30/2011  . Tobacco use disorder 03/30/2011  . ANXIETY 05/02/2008  . DEPRESSION 05/02/2008  . COMMON MIGRAINE 05/02/2008  . SEIZURE DISORDER 05/02/2008    No past surgical history on file.  Current Outpatient Rx  Name  Route  Sig  Dispense  Refill  . ALPRAZolam (XANAX) 1 MG tablet   Oral   Take 1 mg by mouth 4 (four) times daily as needed for anxiety.         Marland Kitchen amphetamine-dextroamphetamine (ADDERALL XR) 30 MG 24 hr capsule   Oral   Take 30 mg by mouth 2 (two) times daily.         Marland Kitchen doxycycline (VIBRAMYCIN) 100 MG capsule   Oral   Take 1 capsule (100 mg total) by mouth 2 (two) times daily.   20 capsule   0   . HYDROcodone-acetaminophen (NORCO) 5-325 MG per tablet   Oral   Take 1-2 tablets by mouth every 4 (four) hours as needed for moderate pain.   15 tablet   0   . ibuprofen (ADVIL,MOTRIN) 800 MG tablet   Oral   Take 1 tablet (800 mg total) by mouth every 8 (eight) hours as needed.   30 tablet   0   . permethrin (ELIMITE) 5 % cream      Apply to affected area once, from chin to toes. Leave 8 hours then shower.  Repeat treatment in one week.   60 g   1   . sertraline (ZOLOFT) 100 MG tablet   Oral   Take 100  mg by mouth daily.         Marland Kitchen sulfamethoxazole-trimethoprim (BACTRIM DS,SEPTRA DS) 800-160 MG per tablet   Oral   Take 1 tablet by mouth 2 (two) times daily.   20 tablet   0   . traZODone (DESYREL) 50 MG tablet   Oral   Take 1 tablet (50 mg total) by mouth at bedtime and may repeat dose one time if needed. For sleep   60 tablet   0     Allergies Review of patient's allergies indicates no known allergies.  Family History  Problem Relation Age of Onset  . Depression Mother   . Anxiety disorder Mother     Social History History  Substance Use Topics  . Smoking status: Current Some Day Smoker -- 0.25 packs/day for 3 years    Types: Cigarettes  . Smokeless tobacco: Never Used  . Alcohol Use: Yes     Comment: occasionally    Review of Systems Constitutional: No fever/chills Eyes: No visual changes. ENT: No sore throat. Cardiovascular: Denies chest pain. Respiratory: Denies shortness of breath. Gastrointestinal: No abdominal pain.  No nausea, no vomiting.  No diarrhea.  No constipation. Genitourinary: Negative for dysuria. Musculoskeletal: Negative for back pain. Skin: Positive for a skin lesion. Neurological: Negative for headaches, focal weakness or numbness.  10-point ROS otherwise negative.  ____________________________________________   PHYSICAL EXAM:  VITAL SIGNS: ED Triage Vitals  Enc Vitals Group     BP 09/07/14 2031 117/81 mmHg     Pulse Rate 09/07/14 2031 83     Resp 09/07/14 2031 18     Temp 09/07/14 2031 98.2 F (36.8 C)     Temp Source 09/07/14 2031 Oral     SpO2 09/07/14 2031 100 %     Weight 09/07/14 2031 160 lb (72.576 kg)     Height 09/07/14 2031  (1.626 m)     Head Cir --      Peak Flow --      Pain Score 09/07/14 2032 9     Pain Loc --      Pain Edu? --      Excl. in GC? --     Constitutional: Alert and oriented. Well appearing and in no acute distress.  Cardiovascular: Normal rate, regular rhythm. Grossly normal heart  sounds.  Good peripheral circulation. Respiratory: Normal respiratory effort.  No retractions. Lungs CTAB. Gastrointestinal: Soft and nontender. No distention. No abdominal bruits. No CVA tenderness. Musculoskeletal: No lower extremity tenderness nor edema.  No joint effusions. Neurologic:  Normal speech and language. No gross focal neurologic deficits are appreciated. No gait instability. Skin:  Skin is warm, dry and intact. No rash noted. Left forearm positive for a 2 cm nummular erythematous area with fluctuance. No head noted. Psychiatric: Mood and affect are normal. Speech and behavior are normal.  ____________________________________________   LABS (all labs ordered are listed, but only abnormal results are displayed)  Labs Reviewed - No data to display ____________________________________________  PROCEDURES  Procedure(s) performed: None  Critical Care performed: No  ____________________________________________   INITIAL IMPRESSION / ASSESSMENT AND PLAN / ED COURSE  Pertinent labs & imaging results that were available during my care of the patient were reviewed by me and considered in my medical decision making (see chart for details).  Kidneys abscess superficial to left forearm. Rx given for Bactrim DS twice a day #20 Motrin 800 mg 3 times a day #30. Patient to return in 48 hours or sooner for worsening worsening symptomology. Patient voices no other emergency medical complaints at this time. ____________________________________________   FINAL CLINICAL IMPRESSION(S) / ED DIAGNOSES  Final diagnoses:  Abscess of arm, left      Evangeline Dakin, PA-C 09/07/14 2124  Arnaldo Natal, MD 09/07/14 2325

## 2014-09-07 NOTE — Discharge Instructions (Signed)

## 2014-09-07 NOTE — ED Notes (Signed)
Patient with abscess to left lower arm. Patient states that she has had it times one week and that it has become worse. Patient has not had any drainage. Patient reports fever of 100 yesterday.

## 2014-09-07 NOTE — ED Notes (Signed)
Pt states she has a abscess on her arm that started growing about a week ago and was the size of a pea and currently the size of a gum ball. Pt states motrin, tylenol and pts mother has given celabrex neither has work per pt.

## 2014-09-08 ENCOUNTER — Emergency Department
Admission: EM | Admit: 2014-09-08 | Discharge: 2014-09-09 | Disposition: A | Discharge status: Home / Self Care | Payer: 59 | Attending: Emergency Medicine | Admitting: Emergency Medicine

## 2014-09-08 ENCOUNTER — Encounter: Payer: Self-pay | Admitting: Emergency Medicine

## 2014-09-08 DIAGNOSIS — Z4800 Encounter for change or removal of nonsurgical wound dressing: Secondary | ICD-10-CM | POA: Diagnosis present

## 2014-09-08 DIAGNOSIS — Z792 Long term (current) use of antibiotics: Secondary | ICD-10-CM | POA: Insufficient documentation

## 2014-09-08 DIAGNOSIS — Z720 Tobacco use: Secondary | ICD-10-CM | POA: Diagnosis not present

## 2014-09-08 DIAGNOSIS — L0291 Cutaneous abscess, unspecified: Secondary | ICD-10-CM

## 2014-09-08 DIAGNOSIS — Z79899 Other long term (current) drug therapy: Secondary | ICD-10-CM | POA: Diagnosis not present

## 2014-09-08 DIAGNOSIS — L02414 Cutaneous abscess of left upper limb: Secondary | ICD-10-CM | POA: Diagnosis not present

## 2014-09-08 HISTORY — DX: Unspecified convulsions: R56.9

## 2014-09-08 HISTORY — DX: Attention-deficit hyperactivity disorder, unspecified type: F90.9

## 2014-09-08 MED ORDER — LIDOCAINE-EPINEPHRINE 1 %-1:100000 IJ SOLN: 10.0000 mL | Freq: Once | INTRAMUSCULAR | Status: DC

## 2014-09-08 MED ORDER — BUPIVACAINE HCL (PF) 0.5 % IJ SOLN
10.0000 mL | Freq: Once | INTRAMUSCULAR | Status: DC
  Filled 2014-09-08: qty 30

## 2014-09-08 NOTE — ED Notes (Signed)
Pt presents to ER alert and in NAD. Pt has gold ball abscess to left forearm. Pt states she was seen here yesterday and told to come back if it got bigger. Pt has redness noted outside drawn line.

## 2014-09-08 NOTE — ED Provider Notes (Signed)
[facility] Emergency Department Provider Note    ____________________________________________  Time seen: 38  I have reviewed the triage vital signs and the nursing notes.   HISTORY  Chief Complaint Wound Check   History limited by: Not Limited   HPI Shelby Branch is a 24 y.o. female who presents to the emergency department with increased redness and pain to her left forearm. The patient was seen yesterday for a complaint of pain and swelling to her mid left forearm. At that time she was evaluated she was given antibiotics and instructed to return if things are worse. She states that the redness and pain did increase today. It was gradually  getting worse.She states she has had to have an abscess lanced in the past.    Past Medical History  Diagnosis Date  . Asthma   . Depression   . Anxiety   . Headache(784.0)   . ADHD (attention deficit hyperactivity disorder)   . Seizures     Patient Active Problem List   Diagnosis Date Noted  . Opioid dependence 09/27/2012  . Seizure 03/30/2011  . Tobacco use disorder 03/30/2011  . ANXIETY 05/02/2008  . DEPRESSION 05/02/2008  . COMMON MIGRAINE 05/02/2008  . SEIZURE DISORDER 05/02/2008    History reviewed. No pertinent past surgical history.  Current Outpatient Rx  Name  Route  Sig  Dispense  Refill  . ALPRAZolam (XANAX) 1 MG tablet   Oral   Take 1 mg by mouth 4 (four) times daily as needed for anxiety.         Marland Kitchen amphetamine-dextroamphetamine (ADDERALL XR) 30 MG 24 hr capsule   Oral   Take 30 mg by mouth 2 (two) times daily.         Marland Kitchen doxycycline (VIBRAMYCIN) 100 MG capsule   Oral   Take 1 capsule (100 mg total) by mouth 2 (two) times daily.   20 capsule   0   . HYDROcodone-acetaminophen (NORCO) 5-325 MG per tablet   Oral   Take 1-2 tablets by mouth every 4 (four) hours as needed for moderate pain.   15 tablet   0   . ibuprofen (ADVIL,MOTRIN) 800 MG tablet   Oral   Take 1 tablet  (800 mg total) by mouth every 8 (eight) hours as needed.   30 tablet   0   . permethrin (ELIMITE) 5 % cream      Apply to affected area once, from chin to toes. Leave 8 hours then shower.  Repeat treatment in one week.   60 g   1   . sertraline (ZOLOFT) 100 MG tablet   Oral   Take 100 mg by mouth daily.         Marland Kitchen sulfamethoxazole-trimethoprim (BACTRIM DS,SEPTRA DS) 800-160 MG per tablet   Oral   Take 1 tablet by mouth 2 (two) times daily.   20 tablet   0   . traZODone (DESYREL) 50 MG tablet   Oral   Take 1 tablet (50 mg total) by mouth at bedtime and may repeat dose one time if needed. For sleep   60 tablet   0     Allergies Review of patient's allergies indicates no known allergies.  Family History  Problem Relation Age of Onset  . Depression Mother   . Anxiety disorder Mother     Social History History  Substance Use Topics  . Smoking status: Current Some Day Smoker -- 0.25 packs/day for 3 years    Types:  Cigarettes  . Smokeless tobacco: Never Used  . Alcohol Use: Yes     Comment: occasionally    Review of Systems  Constitutional: Negative for fever. Cardiovascular: Negative for chest pain. Respiratory: Negative for shortness of breath. Gastrointestinal: Negative for abdominal pain, vomiting and diarrhea. Genitourinary: Negative for dysuria. Musculoskeletal: Negative for back pain. Skin: Negative for rash. Neurological: Negative for headaches, focal weakness or numbness.  10-point ROS otherwise negative.  ____________________________________________   PHYSICAL EXAM:  VITAL SIGNS: ED Triage Vitals  Enc Vitals Group     BP 09/08/14 2208 128/79 mmHg     Pulse Rate 09/08/14 2208 82     Resp 09/08/14 2208 20     Temp 09/08/14 2208 98.3 F (36.8 C)     Temp Source 09/08/14 2208 Oral     SpO2 09/08/14 2208 99 %     Weight 09/08/14 2208 160 lb (72.576 kg)     Height 09/08/14 2208  (1.626 m)     Head Cir --      Peak Flow --      Pain  Score 09/08/14 2208 10   Constitutional: Alert and oriented. Well appearing and in no distress. Eyes: Conjunctivae are normal. PERRL. Normal extraocular movements. ENT   Head: Normocephalic and atraumatic.   Nose: No congestion/rhinnorhea.   Mouth/Throat: Mucous membranes are moist.   Neck: No stridor. Cardiovascular: Normal rate, regular rhythm.   Respiratory: Normal respiratory effort without tachypnea nor retractions. Genitourinary: Deferred Musculoskeletal: Normal range of motion in all extremities. No joint effusions.  No lower extremity tenderness nor edema. Neurologic:  Normal speech and language. No gross focal neurologic deficits are appreciated. Speech is normal.  Skin:  Skin is warm, dry and intact. No rash noted. Discrete and no localized area of roughly 2 cm diameter of erythema, fluctuance to the left forearm. Psychiatric: Mood and affect are normal. Speech and behavior are normal. Patient exhibits appropriate insight and judgment.  ____________________________________________    LABS (pertinent positives/negatives)  None  ____________________________________________   EKG  None  ____________________________________________    RADIOLOGY  None  ____________________________________________   PROCEDURES  Procedure(s) performed: Incision and drainage, see procedure note(s).  Critical Care performed: No  Incision and Drainage of Abcess Anesthesia Local: 1% Lidocaine with Epi with 0.5% bupivicaine  Prep/Procedure: Skin Prep: Chlorahex Incised abscess with #11 blade Purulent discharge: small Probed to break up loculations Packed with 1/4" gauze Estimated blood loss: 2 ml  ____________________________________________   INITIAL IMPRESSION / ASSESSMENT AND PLAN / ED COURSE  Pertinent labs & imaging results that were available during my care of the patient were reviewed by me and considered in my medical decision making (see chart for  details).  Patient presents to the emergency department with continued left forearm redness, swelling. On exam this is consistent with an abscess and ultrasound confirmed a fluid collection under the skin. Incision and drainage was performed with a small amount of purulent discharge. Patient tolerated the procedure well. I instructed patient on care.  ____________________________________________   FINAL CLINICAL IMPRESSION(S) / ED DIAGNOSES  Abscess  Phineas Semen, MD 09/09/14 0104

## 2014-09-09 NOTE — ED Notes (Signed)
Note: pt was actually discharged at 0110.

## 2014-09-09 NOTE — Discharge Instructions (Signed)
Please seek medical attention for any high fevers, chest pain, shortness of breath, change in behavior, persistent vomiting, bloody stool or any other new or concerning symptoms.  Abscess An abscess is an infected area that contains a collection of pus and debris.It can occur in almost any part of the body. An abscess is also known as a furuncle or boil. CAUSES  An abscess occurs when tissue gets infected. This can occur from blockage of oil or sweat glands, infection of hair follicles, or a minor injury to the skin. As the body tries to fight the infection, pus collects in the area and creates pressure under the skin. This pressure causes pain. People with weakened immune systems have difficulty fighting infections and get certain abscesses more often.  SYMPTOMS Usually an abscess develops on the skin and becomes a painful mass that is red, warm, and tender. If the abscess forms under the skin, you may feel a moveable soft area under the skin. Some abscesses break open (rupture) on their own, but most will continue to get worse without care. The infection can spread deeper into the body and eventually into the bloodstream, causing you to feel ill.  DIAGNOSIS  Your caregiver will take your medical history and perform a physical exam. A sample of fluid may also be taken from the abscess to determine what is causing your infection. TREATMENT  Your caregiver may prescribe antibiotic medicines to fight the infection. However, taking antibiotics alone usually does not cure an abscess. Your caregiver may need to make a small cut (incision) in the abscess to drain the pus. In some cases, gauze is packed into the abscess to reduce pain and to continue draining the area. HOME CARE INSTRUCTIONS   Only take over-the-counter or prescription medicines for pain, discomfort, or fever as directed by your caregiver.  If you were prescribed antibiotics, take them as directed. Finish them even if you start to feel  better.  If gauze is used, follow your caregiver's directions for changing the gauze.  To avoid spreading the infection:  Keep your draining abscess covered with a bandage.  Wash your hands well.  Do not share personal care items, towels, or whirlpools with others.  Avoid skin contact with others.  Keep your skin and clothes clean around the abscess.  Keep all follow-up appointments as directed by your caregiver. SEEK MEDICAL CARE IF:   You have increased pain, swelling, redness, fluid drainage, or bleeding.  You have muscle aches, chills, or a general ill feeling.  You have a fever. MAKE SURE YOU:   Understand these instructions.  Will watch your condition.  Will get help right away if you are not doing well or get worse. Document Released: 11/05/2004 Document Revised: 07/28/2011 Document Reviewed: 04/10/2011 [facility] Patient Information 2015 [place], [place]. This information is not intended to replace advice given to you by your health care provider. Make sure you discuss any questions you have with your health care provider.

## 2014-09-21 ENCOUNTER — Telehealth: Payer: Self-pay | Admitting: Internal Medicine

## 2014-09-21 ENCOUNTER — Encounter (HOSPITAL_COMMUNITY): Payer: Self-pay

## 2014-09-21 ENCOUNTER — Emergency Department (HOSPITAL_COMMUNITY)
Admission: EM | Admit: 2014-09-21 | Discharge: 2014-09-21 | Disposition: A | Discharge status: Home / Self Care | Payer: 59 | Attending: Emergency Medicine | Admitting: Emergency Medicine

## 2014-09-21 DIAGNOSIS — F329 Major depressive disorder, single episode, unspecified: Secondary | ICD-10-CM | POA: Insufficient documentation

## 2014-09-21 DIAGNOSIS — F1510 Other stimulant abuse, uncomplicated: Secondary | ICD-10-CM | POA: Insufficient documentation

## 2014-09-21 DIAGNOSIS — F1120 Opioid dependence, uncomplicated: Secondary | ICD-10-CM

## 2014-09-21 DIAGNOSIS — Z720 Tobacco use: Secondary | ICD-10-CM | POA: Diagnosis not present

## 2014-09-21 DIAGNOSIS — J45909 Unspecified asthma, uncomplicated: Secondary | ICD-10-CM | POA: Insufficient documentation

## 2014-09-21 DIAGNOSIS — Z79899 Other long term (current) drug therapy: Secondary | ICD-10-CM | POA: Insufficient documentation

## 2014-09-21 DIAGNOSIS — F419 Anxiety disorder, unspecified: Secondary | ICD-10-CM | POA: Diagnosis not present

## 2014-09-21 DIAGNOSIS — Y998 Other external cause status: Secondary | ICD-10-CM | POA: Diagnosis not present

## 2014-09-21 DIAGNOSIS — Y9289 Other specified places as the place of occurrence of the external cause: Secondary | ICD-10-CM | POA: Diagnosis not present

## 2014-09-21 DIAGNOSIS — F1310 Sedative, hypnotic or anxiolytic abuse, uncomplicated: Secondary | ICD-10-CM | POA: Diagnosis not present

## 2014-09-21 DIAGNOSIS — T50901A Poisoning by unspecified drugs, medicaments and biological substances, accidental (unintentional), initial encounter: Secondary | ICD-10-CM

## 2014-09-21 DIAGNOSIS — T424X1A Poisoning by benzodiazepines, accidental (unintentional), initial encounter: Secondary | ICD-10-CM | POA: Diagnosis not present

## 2014-09-21 DIAGNOSIS — F909 Attention-deficit hyperactivity disorder, unspecified type: Secondary | ICD-10-CM | POA: Insufficient documentation

## 2014-09-21 DIAGNOSIS — R000 Tachycardia, unspecified: Secondary | ICD-10-CM | POA: Diagnosis not present

## 2014-09-21 DIAGNOSIS — Y9389 Activity, other specified: Secondary | ICD-10-CM | POA: Insufficient documentation

## 2014-09-21 LAB — CBG MONITORING, ED: GLUCOSE-CAPILLARY: 81 mg/dL (ref 65–99)

## 2014-09-21 LAB — ACETAMINOPHEN LEVEL: Acetaminophen (Tylenol), Serum: <10 ug/mL — ABNORMAL LOW (ref 10–30)

## 2014-09-21 LAB — CBC WITH DIFFERENTIAL/PLATELET
Basophils Absolute: 0 10*3/uL (ref 0.0–0.1)
Basophils Relative: 0 % (ref 0–1)
EOS ABS: 0.2 10*3/uL (ref 0.0–0.7)
EOS PCT: 2 % (ref 0–5)
HCT: 34.3 % — ABNORMAL LOW (ref 36.0–46.0)
Hemoglobin: 11 g/dL — ABNORMAL LOW (ref 12.0–15.0)
LYMPHS ABS: 3.1 10*3/uL (ref 0.7–4.0)
Lymphocytes Relative: 39 % (ref 12–46)
MCH: 27.7 pg (ref 26.0–34.0)
MCHC: 32.1 g/dL (ref 30.0–36.0)
MCV: 86.4 fL (ref 78.0–100.0)
MONO ABS: 0.8 10*3/uL (ref 0.1–1.0)
MONOS PCT: 10 % (ref 3–12)
NEUTROS PCT: 49 % (ref 43–77)
Neutro Abs: 3.9 10*3/uL (ref 1.7–7.7)
PLATELETS: 319 10*3/uL (ref 150–400)
RBC: 3.97 MIL/uL (ref 3.87–5.11)
RDW: 14.6 % (ref 11.5–15.5)
WBC: 8 10*3/uL (ref 4.0–10.5)

## 2014-09-21 LAB — RAPID URINE DRUG SCREEN, HOSP PERFORMED
AMPHETAMINES: POSITIVE — AB
Barbiturates: NOT DETECTED
Benzodiazepines: POSITIVE — AB
Cocaine: NOT DETECTED
OPIATES: NOT DETECTED
TETRAHYDROCANNABINOL: NOT DETECTED

## 2014-09-21 LAB — SALICYLATE LEVEL: Salicylate Lvl: <4 mg/dL (ref 2.8–30.0)

## 2014-09-21 LAB — BASIC METABOLIC PANEL
ANION GAP: 9 (ref 5–15)
BUN: 8 mg/dL (ref 6–20)
CO2: 23 mmol/L (ref 22–32)
Calcium: 9.2 mg/dL (ref 8.9–10.3)
Chloride: 106 mmol/L (ref 101–111)
Creatinine, Ser: 0.86 mg/dL (ref 0.44–1.00)
GFR calc non Af Amer: >60 mL/min (ref >60)
Glucose, Bld: 89 mg/dL (ref 65–99)
POTASSIUM: 3.9 mmol/L (ref 3.5–5.1)
SODIUM: 138 mmol/L (ref 135–145)

## 2014-09-21 LAB — ETHANOL: Alcohol, Ethyl (B): <5 mg/dL (ref <5)

## 2014-09-21 MED ORDER — SODIUM CHLORIDE 0.9 % IV BOLUS (SEPSIS)
1000.0000 mL | Freq: Once | INTRAVENOUS | Status: AC
  Administered 2014-09-21: 1000 mL via INTRAVENOUS

## 2014-09-21 NOTE — Discharge Instructions (Signed)
If you were given medicines take as directed.  If you are on coumadin or contraceptives realize their levels and effectiveness is altered by many different medicines.  If you have any reaction (rash, tongues swelling, other) to the medicines stop taking and see a physician.   Avoid taking extra doses of medicines, avoid sleeping medicines.   Ensure that some is with you at all times the following 12 hours, if she stops breathing or any concerns return to the ER immediately.    If your blood pressure was elevated in the ER make sure you follow up for management with a primary doctor or return for chest pain, shortness of breath or stroke symptoms.  Please follow up as directed and return to the ER or see a physician for new or worsening symptoms.  Thank you. Filed Vitals:   09/21/14 1642 09/21/14 1917 09/21/14 1918  BP: 136/90 129/82 129/82  Pulse: 120 110 113  Temp: 98 F (36.7 C)    TempSrc: Oral    Resp: Height:  (1.626 m)    Weight: 149 lb (67.586 kg)    SpO2: 98% 99% 98%

## 2014-09-21 NOTE — ED Notes (Signed)
Pt alert and oriented in NAD as she is being discharged from ED to home with her Father

## 2014-09-21 NOTE — ED Notes (Signed)
Pt placed on cardiac monitor at arrival of this nurse to assignment,  Pt was tachycardic and very sleepy ,  Pt continues to be very sleepy and falling in and out of sleep during questions from pharmacy.

## 2014-09-21 NOTE — Telephone Encounter (Signed)
Pt called wanting to make an appointment with you She was in the ER a couple weeks ago and you are on her chart as her PCP and has been for several years.  Her father, [name], is a long time patient of yours. Can you take her on as a new patient Please advise

## 2014-09-21 NOTE — ED Notes (Signed)
Pt states that she took about 10 xanax and double dose of methadone ,  She states she came off of Heroin in February 2015,  Pt also states she wasn't trying to harm or herself or get high,  States she just wants to sleep,  Pt's speech is slurred but she is alert and oriented,  Her father is at bedside

## 2014-09-21 NOTE — ED Notes (Signed)
Pt. On cardiac monitor. 

## 2014-09-21 NOTE — ED Provider Notes (Signed)
CSN: [id number]     Arrival date & time 09/21/14  1638 History   First MD Initiated Contact with Patient 09/21/14 1700     Chief Complaint  Patient presents with  . Drug Overdose     (Consider location/radiation/quality/duration/timing/severity/associated sxs/prior Treatment) HPI Comments: 24 year old female with history of depression, anxiety, opiate dependence, tobacco abuse presents with her father for drug overdose. Patient has a history long-term of apparent abuse and narcotic addiction currently on methadone 10 mg a day. Patient went to the clinic and had a 10 mg pill however decided around 1:30 PM to take Axid 10 mg to help her vague multiple symptoms. Patient also took double her Xanax for total of 4 mg. Patient had a headache and couldn't sleep so then took Ambien to try to help her sleep which help. Patient had lethargy and has improved since per her report and father. Father said he is seen her significantly worse in the past and right now she is mild sleepy. Today's patient's birthday and she denies suicidal intent or plan.  Patient is a 24 y.o. female presenting with Overdose. The history is provided by the patient.  Drug Overdose Pertinent negatives include no chest pain, no abdominal pain, no headaches and no shortness of breath.    Past Medical History  Diagnosis Date  . Asthma   . Depression   . Anxiety   . Headache(784.0)   . ADHD (attention deficit hyperactivity disorder)   . Seizures    Past Surgical History  Procedure Laterality Date  . Wisdom tooth extraction     Family History  Problem Relation Age of Onset  . Depression Mother   . Anxiety disorder Mother    Social History  Substance Use Topics  . Smoking status: Current Some Day Smoker -- 0.25 packs/day for 3 years    Types: Cigarettes  . Smokeless tobacco: Never Used  . Alcohol Use: Yes     Comment: occasionally   OB History    No data available     Review of Systems  Constitutional: Positive  for fatigue. Negative for fever and chills.  HENT: Negative for congestion.   Eyes: Negative for visual disturbance.  Respiratory: Negative for shortness of breath.   Cardiovascular: Negative for chest pain.  Gastrointestinal: Negative for vomiting and abdominal pain.  Genitourinary: Negative for dysuria and flank pain.  Musculoskeletal: Negative for back pain, neck pain and neck stiffness.  Skin: Negative for rash.  Neurological: Negative for light-headedness and headaches.      Allergies  Bee venom and Metformin and related  Home Medications   Prior to Admission medications   Medication Sig Start Date End Date Taking? Authorizing Provider  ALPRAZolam Prudy Feeler) 1 MG tablet Take 1 mg by mouth 4 (four) times daily as needed for anxiety.   Yes Historical Provider, MD  amphetamine-dextroamphetamine (ADDERALL XR) 30 MG 24 hr capsule Take 30 mg by mouth 2 (two) times daily.   Yes Historical Provider, MD  ibuprofen (ADVIL,MOTRIN) 800 MG tablet Take 1 tablet (800 mg total) by mouth every 8 (eight) hours as needed. Patient taking differently: Take 800 mg by mouth every 8 (eight) hours as needed for headache or moderate pain.  09/07/14  Yes Charmayne Sheer Beers, PA-C  medroxyPROGESTERone (PROVERA) 5 MG tablet Take 5 mg by mouth as directed. Take for 5 days when on menstrual cycle   Yes Historical Provider, MD  sulfamethoxazole-trimethoprim (BACTRIM DS,SEPTRA DS) 800-160 MG per tablet Take 1 tablet by mouth 2 (two)  times daily. 09/07/14  Yes Charmayne Sheer Beers, PA-C  traZODone (DESYREL) 50 MG tablet Take 1 tablet (50 mg total) by mouth at bedtime and may repeat dose one time if needed. For sleep 09/30/12  Yes Sanjuana Kava, NP  HYDROcodone-acetaminophen (NORCO) 5-325 MG per tablet Take 1-2 tablets by mouth every 4 (four) hours as needed for moderate pain. Patient not taking: Reported on 09/21/2014 09/07/14   Charmayne Sheer Beers, PA-C  permethrin (ELIMITE) 5 % cream Apply to affected area once, from chin to toes.  Leave 8 hours then shower.  Repeat treatment in one week. Patient not taking: Reported on 09/21/2014 02/07/14   Jess Barters H Presson, PA   BP 129/82 mmHg  Pulse 113  Temp(Src) 98 F (36.7 C) (Oral)  Resp 23  Ht 5\' 4"  (1.626 m)  Wt 149 lb (67.586 kg)  BMI 25.56 kg/m2  SpO2 98%  LMP 08/24/2014 (Exact Date) Physical Exam  Constitutional: She is oriented to person, place, and time. She appears well-developed and well-nourished.  HENT:  Head: Normocephalic and atraumatic.  Eyes: Conjunctivae are normal. Right eye exhibits no discharge. Left eye exhibits no discharge.  Neck: Normal range of motion. Neck supple. No tracheal deviation present.  Cardiovascular: Regular rhythm.  Tachycardia present.   Pulmonary/Chest: Effort normal and breath sounds normal.  Abdominal: Soft. She exhibits no distension. There is no tenderness. There is no guarding.  Musculoskeletal: She exhibits no edema.  Neurological: She is alert and oriented to person, place, and time. No cranial nerve deficit.  Mild sleepy, easily arouseable   Skin: Skin is warm. No rash noted.  Psychiatric:  Sleepy mild, verbal discussion without difficulty  Nursing note and vitals reviewed.   ED Course  Procedures (including critical care time) Labs Review Labs Reviewed  CBC WITH DIFFERENTIAL/PLATELET - Abnormal; Notable for the following:    Hemoglobin 11.0 (*)    HCT 34.3 (*)    All other components within normal limits  ACETAMINOPHEN LEVEL - Abnormal; Notable for the following:    Acetaminophen (Tylenol), Serum <10 (*)    All other components within normal limits  URINE RAPID DRUG SCREEN, HOSP PERFORMED - Abnormal; Notable for the following:    Benzodiazepines POSITIVE (*)    Amphetamines POSITIVE (*)    All other components within normal limits  BASIC METABOLIC PANEL  SALICYLATE LEVEL  ETHANOL  CBG MONITORING, ED    Imaging Review No results found. I, Enid Skeens, personally reviewed and evaluated these  images and lab results as part of my medical decision-making.   EKG Interpretation   Date/Time:  Friday September 21 2014 16:48:20 EDT Ventricular Rate:  121 PR Interval:  143 QRS Duration: 76 QT Interval:  324 QTC Calculation: 460 R Axis:   91 Text Interpretation:  Sinus tachycardia Borderline right axis deviation  Low voltage, precordial leads Borderline T abnormalities, anterior leads  Confirmed by Rudell Ortman  MD, Gale Hulse ([id number]) on 09/21/2014 7:21:05 PM      MDM   Final diagnoses:  Drug overdose, accidental or unintentional, initial encounter  Uncomplicated opioid dependence   Patient with complicated drug abuse history currently methadone presents after taking double her dose of methadone and Xanax as well as taking Valium. Patient has no suicidal attempt or plan. Father agrees this is mild for patient and plan for holding on blood work at this time and observing, oral fluids and monitoring vitals. Patient continues do well father's comfortable making sure that some is with her the next 44  hours as the understanding that methadone is longer acting and she could worsen.  Patient observed in the ER multiple rechecks. Patient sleepy however easily arousable, saturations do not drop all sleeping. Patient and father comfortable going home and being monitored by family. Patient has some in within the next 24 hours and even when she sleeps to wake her up periodically check on her. Discussed the risk of all the sedatives she took and this may get worse tonight unable to predict. Patient father comfortable with this plan.   Patient's heart rate improved however still tachycardic. Likely drug-induced and dehydration. Plan for IV fluids, general blood work.  Results and differential diagnosis were discussed with the patient/parent/guardian. Xrays were independently reviewed by myself.  Close follow up outpatient was discussed, comfortable with the plan.   Medications  sodium chloride 0.9 % bolus  1,000 mL (0 mLs Intravenous Stopped 09/21/14 2114)    Filed Vitals:   09/21/14 1642 09/21/14 1917 09/21/14 1918  BP: 136/90 129/82 129/82  Pulse: 120 110 113  Temp: 98 F (36.7 C)    TempSrc: Oral    Resp: Height:  (1.626 m)    Weight: 149 lb (67.586 kg)    SpO2: 98% 99% 98%    Final diagnoses:  Drug overdose, accidental or unintentional, initial encounter  Uncomplicated opioid dependence      Blane Ohara, MD 09/21/14 2121

## 2014-09-21 NOTE — Telephone Encounter (Signed)
Very sorry, I am unable to accept this pt at this time, thanks

## 2014-09-21 NOTE — Telephone Encounter (Signed)
Pt informed

## 2014-09-21 NOTE — ED Notes (Addendum)
Patient's friend dropped patient off at the lobby. Patient's friend states that the patient had a xanax prescription filled yesterday and the bottle was empty. Patient denies. Patient states she took Xanax 8 mg and Methadone 10 mg po.Patient is very sleepy. Patient states today is her birthday and she just took a little extra. Patient denies SI/HI.

## 2015-01-15 ENCOUNTER — Emergency Department (HOSPITAL_COMMUNITY)
Admission: EM | Admit: 2015-01-15 | Discharge: 2015-01-15 | Disposition: A | Discharge status: Home / Self Care | Payer: 59 | Attending: Emergency Medicine | Admitting: Emergency Medicine

## 2015-01-15 ENCOUNTER — Encounter (HOSPITAL_COMMUNITY): Payer: Self-pay

## 2015-01-15 DIAGNOSIS — F329 Major depressive disorder, single episode, unspecified: Secondary | ICD-10-CM | POA: Insufficient documentation

## 2015-01-15 DIAGNOSIS — F909 Attention-deficit hyperactivity disorder, unspecified type: Secondary | ICD-10-CM | POA: Insufficient documentation

## 2015-01-15 DIAGNOSIS — T401X1A Poisoning by heroin, accidental (unintentional), initial encounter: Secondary | ICD-10-CM | POA: Diagnosis not present

## 2015-01-15 DIAGNOSIS — Z79899 Other long term (current) drug therapy: Secondary | ICD-10-CM | POA: Insufficient documentation

## 2015-01-15 DIAGNOSIS — F419 Anxiety disorder, unspecified: Secondary | ICD-10-CM | POA: Insufficient documentation

## 2015-01-15 DIAGNOSIS — Y998 Other external cause status: Secondary | ICD-10-CM | POA: Insufficient documentation

## 2015-01-15 DIAGNOSIS — Y9289 Other specified places as the place of occurrence of the external cause: Secondary | ICD-10-CM | POA: Diagnosis not present

## 2015-01-15 DIAGNOSIS — Z792 Long term (current) use of antibiotics: Secondary | ICD-10-CM | POA: Insufficient documentation

## 2015-01-15 DIAGNOSIS — R000 Tachycardia, unspecified: Secondary | ICD-10-CM | POA: Insufficient documentation

## 2015-01-15 DIAGNOSIS — F17210 Nicotine dependence, cigarettes, uncomplicated: Secondary | ICD-10-CM | POA: Insufficient documentation

## 2015-01-15 DIAGNOSIS — J45909 Unspecified asthma, uncomplicated: Secondary | ICD-10-CM | POA: Insufficient documentation

## 2015-01-15 DIAGNOSIS — Y9389 Activity, other specified: Secondary | ICD-10-CM | POA: Diagnosis not present

## 2015-01-15 MED ORDER — NALOXONE HCL 2 MG/2ML IJ SOSY
2.0000 mg | PREFILLED_SYRINGE | Freq: Once | INTRAMUSCULAR | Status: AC
  Administered 2015-01-15: 2 mg via INTRAVENOUS
  Filled 2015-01-15: qty 2

## 2015-01-15 NOTE — ED Notes (Signed)
Patient ambulated around the [facility] ED without assistance. Food and drink given to patient. Father at the bedside.

## 2015-01-15 NOTE — ED Notes (Addendum)
Room air sats-87%. Patient placed on O2 2l/min via [place] and sats increased to 92%. O2 increased to 3L/min via [place] and sats increased to 96%. Patient awake now and talking on the phone.

## 2015-01-15 NOTE — ED Provider Notes (Signed)
Handoff received from Dr. Rubin Payor at 1500 with plan for reassessment for heroin OD.   Results:  BP 113/84 mmHg  Pulse 104  Temp(Src) 97.6 F (36.4 C) (Oral)  Resp 12  Ht  (1.626 m)  Wt 160 lb (72.576 kg)  BMI 27.45 kg/m2  SpO2 87%  LMP 12/26/2014  Results for orders placed or performed during the hospital encounter of 09/21/14  CBC with Differential/Platelet  Result Value Ref Range   WBC 8.0 4.0 - 10.5 K/uL   RBC 3.97 3.87 - 5.11 MIL/uL   Hemoglobin 11.0 (L) 12.0 - 15.0 g/dL   HCT 40.9 (L) 81.1 - 91.4 %   MCV 86.4 78.0 - 100.0 fL   MCH 27.7 26.0 - 34.0 pg   MCHC 32.1 30.0 - 36.0 g/dL   RDW 78.2 95.6 - 21.3 %   Platelets 319 150 - 400 K/uL   Neutrophils Relative % 49 43 - 77 %   Neutro Abs 3.9 1.7 - 7.7 K/uL   Lymphocytes Relative 39 12 - 46 %   Lymphs Abs 3.1 0.7 - 4.0 K/uL   Monocytes Relative 10 3 - 12 %   Monocytes Absolute 0.8 0.1 - 1.0 K/uL   Eosinophils Relative 2 0 - 5 %   Eosinophils Absolute 0.2 0.0 - 0.7 K/uL   Basophils Relative 0 0 - 1 %   Basophils Absolute 0.0 0.0 - 0.1 K/uL  Basic metabolic panel  Result Value Ref Range   Sodium 138 135 - 145 mmol/L   Potassium 3.9 3.5 - 5.1 mmol/L   Chloride 106 101 - 111 mmol/L   CO2 23 22 - 32 mmol/L   Glucose, Bld 89 65 - 99 mg/dL   BUN 8 6 - 20 mg/dL   Creatinine, Ser 0.86 0.44 - 1.00 mg/dL   Calcium 9.2 8.9 - 57.8 mg/dL   GFR calc non Af Amer >60 >60 mL/min   GFR calc Af Amer >60 >60 mL/min   Anion gap 9 5 - 15  Acetaminophen level  Result Value Ref Range   Acetaminophen (Tylenol), Serum <10 (L) 10 - 30 ug/mL  Salicylate level  Result Value Ref Range   Salicylate Lvl <4.0 2.8 - 30.0 mg/dL  Ethanol  Result Value Ref Range   Alcohol, Ethyl (B) <5 <5 mg/dL  Urine rapid drug screen (hosp performed)  Result Value Ref Range   Opiates NONE DETECTED NONE DETECTED   Cocaine NONE DETECTED NONE DETECTED   Benzodiazepines POSITIVE (A) NONE DETECTED   Amphetamines POSITIVE (A) NONE DETECTED   Tetrahydrocannabinol NONE DETECTED NONE DETECTED   Barbiturates NONE DETECTED NONE DETECTED  POC CBG, ED  Result Value Ref Range   Glucose-Capillary 81 65 - 99 mg/dL    No results found.  Diagnoses that have been ruled out:  None  Diagnoses that are still under consideration:  None  Final diagnoses:  Heroin overdose, accidental or unintentional, initial encounter    24 year old female presenting with heroin overdose and respiratory depression that was reversed by 4 mg of Narcan prior to arrival. Was monitored here and required low-flow oxygen to maintain oxygen saturation. Remained lethargic but arousable throughout her emergency department course, required another round of 2 mg of Narcan and had good response, able to tolerate food and water by mouth. She is not symptomatic and she has dark nail polish on which likely complicates the pulse oximetry readings. She was able to ambulate unassisted in the emergency department. Her father expressed concern that she will  not be entered into inpatient rehabilitation but patient appears to have overdosed accidentally and is stable for discharge. Strict return precautions were discussed and I instructed the patient's father to monitor her and call 911 immediately if any signs of respiratory difficulty are noted.  Lyndal Pulleyaniel Tiago Humphrey, MD 01/15/15 [phone]

## 2015-01-15 NOTE — ED Notes (Signed)
Bed: RESB Expected date:  Expected time:  Means of arrival:  Comments: Heroin OD 

## 2015-01-15 NOTE — Progress Notes (Addendum)
Pt confirms no pcp She was provided a 6 page list of in network internal medicine MDs within zip code [phone] for f/u care   Entered in d/c instructions  Please use the 6 page list of in network [facility] doctors within zip code [phone] Schedule an appointment as soon as possible for a visit on 01/16/2015 Use this list to follow up with a doctor after leaving the ED

## 2015-01-15 NOTE — ED Notes (Addendum)
Per EMS- Patient was in a car and when officers approached, they noted the patient and a friend. Patient was not breathing. Patient currently has a nasal trumpet in the right nares. Patient was given a total of Narcan 4 mg IV. Patient is awake and alert. Patient just recently finished rehab in [place] for 45 days.

## 2015-01-15 NOTE — ED Provider Notes (Signed)
CSN: [id number]     Arrival date & time 01/15/15  1403 History   First MD Initiated Contact with Patient 01/15/15 1415     Chief Complaint  Patient presents with  . heroin OD      (Consider location/radiation/quality/duration/timing/severity/associated sxs/prior Treatment) The history is provided by the patient and the EMS personnel.   patient presents after a heroin overdose. She states she and her roommate shared a gram of heroin. She had rather recently been in rehabilitation in [place] and although this would've been a decent amount for her before it was too much for her now. She states that she doesn't remember but reportedly was found unresponsive in a car. She was not breathing and was given for a total of 4 mg of Narcan. She didn't arouse. She did inject into her right forearm. She states that she used some MDMA last night but denies other drug use. She denies possibility of pregnancy. States she knows she needs to stop using heroin and that she will stop on her own and does not need further treatment.  Past Medical History  Diagnosis Date  . Asthma   . Depression   . Anxiety   . Headache(784.0)   . ADHD (attention deficit hyperactivity disorder)   . Seizures [facility])    Past Surgical History  Procedure Laterality Date  . Wisdom tooth extraction     Family History  Problem Relation Age of Onset  . Depression Mother   . Anxiety disorder Mother    Social History  Substance Use Topics  . Smoking status: Current Some Day Smoker -- 0.25 packs/day for 3 years    Types: Cigarettes  . Smokeless tobacco: Never Used  . Alcohol Use: Yes     Comment: occasionally   OB History    No data available     Review of Systems  Constitutional: Negative for appetite change.  Respiratory: Negative for shortness of breath.   Cardiovascular: Negative for chest pain.  Gastrointestinal: Negative for nausea, vomiting and diarrhea.  Genitourinary: Negative for flank pain.  Musculoskeletal:  Negative for back pain.  Hematological: Does not bruise/bleed easily.      Allergies  Bee venom and Metformin and related  Home Medications   Prior to Admission medications   Medication Sig Start Date End Date Taking? Authorizing Provider  ALPRAZolam Duanne Moron) 1 MG tablet Take 1 mg by mouth 4 (four) times daily as needed for anxiety.    Historical Provider, MD  amphetamine-dextroamphetamine (ADDERALL XR) 30 MG 24 hr capsule Take 30 mg by mouth 2 (two) times daily.    Historical Provider, MD  HYDROcodone-acetaminophen (NORCO) 5-325 MG per tablet Take 1-2 tablets by mouth every 4 (four) hours as needed for moderate pain. Patient not taking: Reported on 09/21/2014 09/07/14   Pierce Crane Beers, PA-C  ibuprofen (ADVIL,MOTRIN) 800 MG tablet Take 1 tablet (800 mg total) by mouth every 8 (eight) hours as needed. Patient taking differently: Take 800 mg by mouth every 8 (eight) hours as needed for headache or moderate pain.  09/07/14   Pierce Crane Beers, PA-C  medroxyPROGESTERone (PROVERA) 5 MG tablet Take 5 mg by mouth as directed. Take for 5 days when on menstrual cycle    Historical Provider, MD  permethrin (ELIMITE) 5 % cream Apply to affected area once, from chin to toes. Leave 8 hours then shower.  Repeat treatment in one week. Patient not taking: Reported on 09/21/2014 02/07/14   Lutricia Feil, PA  sulfamethoxazole-trimethoprim (BACTRIM DS,SEPTRA  DS) 800-160 MG per tablet Take 1 tablet by mouth 2 (two) times daily. 09/07/14   Arlyss Repress, PA-C  traZODone (DESYREL) 50 MG tablet Take 1 tablet (50 mg total) by mouth at bedtime and may repeat dose one time if needed. For sleep 09/30/12   Encarnacion Slates, NP   BP 131/86 mmHg  Pulse 124  Temp(Src) 97.6 F (36.4 C) (Oral)  Resp 18  Ht $R'5\' 4"'hA$  (1.626 m)  Wt 160 lb (72.576 kg)  BMI 27.45 kg/m2  SpO2 98%  LMP 12/26/2014 Physical Exam  Constitutional: She appears well-developed.  HENT:  Head: Atraumatic.  Cardiovascular:  Mild tachycardia   Pulmonary/Chest: Effort normal.  Abdominal: Soft.  Musculoskeletal: Normal range of motion.  Skin:  Injection area to right antecubital area. No abscess.    ED Course  Procedures (including critical care time) Labs Review Labs Reviewed - No data to display  Imaging Review No results found. I have personally reviewed and evaluated these images and lab results as part of my medical decision-making.   EKG Interpretation None      MDM   Final diagnoses:  Heroin overdose, accidental or unintentional, initial encounter    Patient with a axonal heroin overdose. States she will get treatment on her own. Required 4 mg of Narcan at this point we're monitoring for further decompensation. Appears medically stable discharge home. Patient given an [place] harm reduction Narcan kit.     Davonna Belling, MD 01/15/15 1505

## 2015-01-15 NOTE — Discharge Instructions (Signed)
Narcotic Overdose A narcotic overdose is the misuse or overuse of a narcotic drug. A narcotic overdose can make you pass out and stop breathing. If you are not treated right away, this can cause permanent brain damage or stop your heart. Medicine may be given to reverse the effects of an overdose. If so, this medicine may bring on withdrawal symptoms. The symptoms may be abdominal cramps, throwing up (vomiting), sweating, chills, and nervousness. Injecting narcotics can cause more problems than just an overdose. AIDS, hepatitis, and other very serious infections are transmitted by sharing needles and syringes. If you decide to quit using, there are medicines which can help you through the withdrawal period. Trying to quit all at once on your own can be uncomfortable, but not life-threatening. Call your caregiver, Narcotics Anonymous, or any drug and alcohol treatment program for further help.    This information is not intended to replace advice given to you by your health care provider. Make sure you discuss any questions you have with your health care provider.   Document Released: 03/05/2004 Document Revised: 02/16/2014 Document Reviewed: 07/18/2014 Elsevier Interactive Patient Education 2016 [employer]   Emergency Department Resource Guide 1) Find a Doctor and Pay Out of Pocket Although you won't have to find out who is covered by your insurance plan, it is a good idea to ask around and get recommendations. You will then need to call the office and see if the doctor you have chosen will accept you as a new patient and what types of options they offer for patients who are self-pay. Some doctors offer discounts or will set up payment plans for their patients who do not have insurance, but you will need to ask so you aren't surprised when you get to your appointment.  2) Contact Your Local Health Department Not all health departments have doctors that can see patients for sick visits, but many  do, so it is worth a call to see if yours does. If you don't know where your local health department is, you can check in your phone book. The CDC also has a tool to help you locate your state's health department, and many state websites also have listings of all of their local health departments.  3) Find a Walk-in Clinic If your illness is not likely to be very severe or complicated, you may want to try a walk in clinic. These are popping up all over the country in pharmacies, drugstores, and shopping centers. They're usually staffed by nurse practitioners or physician assistants that have been trained to treat common illnesses and complaints. They're usually fairly quick and inexpensive. However, if you have serious medical issues or chronic medical problems, these are probably not your best option.  No Primary Care Doctor: - Call Health Connect at  [phone] - they can help you locate a primary care doctor that  accepts your insurance, provides certain services, etc. - Physician Referral Service- [phone]  Chronic Pain Problems: Organization         Address  Phone   Notes  [facility]  [phone] Patients need to be referred by their primary care doctor.   Medication Assistance: Organization         Address  Phone   Notes  [facility] [place] [facility] [address] [place], [place] [id number] [phone] --Must be a resident [place] -- Must have NO insurance coverage whatsoever (no Medicaid/ Medicare, etc.) -- The pt.  pt. MUST have a primary care doctor that directs their care regularly and follows them in the community °  °[facility]ist  (866) 331-1348   °United Way  (888) 892-1162   ° °Agencies that provide inexpensive medical care: °Organization         Address  Phone   Notes  °[facility]  (336) 832-8035   °[facility]    (336) 832-7272   °[facility] [address] °[place], [place] [address] (336) 832-4777   °[facility] [address], °[place] (336) 271-4999   °[facility]    (336) 373-0678   °[facility]    (336) 272-1050   °[facility] ° [address], [place] Phone:  (336) 832-4444, Fax:  (336) 832-4440 Hours of Operation:  9 am - 6 pm, M-F.  Also accepts Medicaid/Medicare and self-pay.  ° [facility] ° [address], [place] Phone: (336) 832-3150, Fax: (336) [phone]. Hours of Operation:  8:30 am - 5:30 pm, M-F.  Also accepts Medicaid and self-pay.  °[facility] [place] [address], [place] Phone: (336) 878-6027   °[facility] [address], [place], [place] (336)723-1848, Ext. 123 Mondays & Thursdays: 7-9 AM.  First 15 patients are seen on a first come, first serve basis. °  ° °Medicaid-accepting [place] Providers: ° °Organization         Address  Phone   Notes  °[facility] [address], [place] (336) [phone] Also accepts self-pay patients.  °[facility] [address], [place] ° (336) 856-9996   °[facility] [address], [place] (336) 288-8857   °[facility] [address] [place] [address], [place] (336) 299-7000   °Veita Bland [address], [place]  ° (336) [phone] Only accepts [place] Access Medicaid patients after they have their name applied to their card.  ° °Self-Pay (no insurance) in [place]: ° °Organization         Address  Phone   Notes  °Sickle Cell Patients, [facility] [address], [place] (336) 832-1970   °[facility] [address], [place] (336) 832-4400   °[facility] ° [address], [place] (336) 992-4800   °[facility]/Dr. Osei-Bonsu ° [address] [place] [address], [place] or  [address], [place] (336) [phone] Phone number for both [place] and [place] locations is the same.  °[facility] [address], [place] (336) 299-0000   °[facility] [address] [place] [address], [place] or [address] (336) 852-7530 °(336) 878-2260   °[facility] [address], [place] (336) 350-1642, phone; (336) 294-5005, fax Sees patients 1st and 3rd Saturday of every month.  Must not qualify for public or private insurance (i.e. Medicaid, Medicare, [place] Health Choice, Veterans' Benefits) • Household income should be no more than 200% of the poverty level •The clinic cannot treat you if you are pregnant or think you are pregnant • Sexually transmitted diseases are not treated at the clinic.  ° ° °Dental Care: °Organization         Address  Phone  Notes  °[facility] [address], [place] (336) 641-6152 Accepts children up to age 21 who are enrolled in Medicaid or [place] Health Choice; pregnant women with a Medicaid card; and children who have applied for Medicaid or [place] Health Choice, but were declined, whose parents can pay a reduced fee at time of service.  °[facility] [place]  [address], [place] (336) 641-7733 Accepts children up to age 21 who are enrolled in Medicaid or [place] Health Choice; pregnant women with a Medicaid card; and children who have applied for Medicaid or [place] Health Choice, but were declined, whose parents can pay a reduced fee at time of service.  °[facility] ° [address], [place] (336) 641-4533 Patients are seen by appointment only. Walk-ins are not accepted. [facility] will see patients 18 years of age and older. °Monday - Tuesday (8am-5pm) °Most Wednesdays (8:30-5pm) °$30 per visit, cash only  °[facility] ° [address], [place] (336) 641-4533 Patients are seen by appointment only. Walk-ins are not accepted. [facility] will see patients 18 years of age and older. °One Wednesday Evening (Monthly: Volunteer Based).  $30 per visit, cash only  °[facility]  (919) 537-3737 for adults; Children under age 4, call Graduate Pediatric Dentistry at ([phone]) [phone]. Children aged 4-14, please call ([phone]) [phone] to request a pediatric application. ° Dental services are provided in all areas of dental care including fillings, crowns and bridges, complete and partial dentures, implants, gum treatment, root canals, and extractions. Preventive care is also provided. Treatment is provided to both adults and children. °Patients are selected via a lottery and there is often a waiting list. °  °[facility] [address], °[place] ° (336) 763-8833 [email or web address] °  °[facility] [address], [place], [place] (336)723-1848, Ext. 123 Second and Fourth Thursday of each month, opens at 6:30 AM; Clinic ends at 9 AM.  Patients are seen on a first-come first-served basis, and a limited number are seen during each clinic.  ° °[facility] ° [address], [place], [place] (336) 723-7904   Eligibility Requirements °You must have lived in [place], or [place] counties for at least the last three months. °  You cannot be eligible for state or federal sponsored healthcare insurance, including Veterans Administration, Medicaid, or Medicare. °  You generally cannot be eligible for healthcare insurance through your employer.  °  How to apply: °Eligibility screenings are held every Tuesday and Wednesday afternoon from 1:00 pm until 4:00 pm. You do not need an appointment for the interview!  °[facility] [address], [place], [place] 336-631-2330   °[facility]  336-342-8273   °[facility]  336-703-3100   °[facility]  336-570-6415   ° °Behavioral Health Resources in the Community: °Intensive Outpatient Programs °Organization         Address  Phone  Notes  °[facility] [address], [place], [place] 336-878-6098   °[facility] [address], [place], [place] 336-832-9800   °[facility] [address], [place], [place] ° 336-882-2125   °[facility] [address],  °[place], [place] 1-800-853-5163 or 336-641-4981   °Substance Abuse Resources °Organization         Address  Phone  Notes  °[facility]  336-882-2125   °[facility]  336-784-9470   °[facility]  336-285-9073   °[facility]  336-845-3988   °Residential & Outpatient Substance Abuse Program  1-800-659-3381   °Psychological Services °Organization         Address  Phone  Notes  °[facility]  336- 832-9600   °[facility]  336- 378-7881   °[facility] [address], [place] 1-800-853-5163 or 336-641-4981   ° °Mobile Crisis Teams °Organization         Address  Phone  Notes  °[facility], Mobile Crisis Care Unit  1-877-626-1772   °[facility] °[facility] ° [address] [place], [place] 336-834-9664   °Sharon DeEsch [address] °[place] 336-554-5454   ° °Self-Help/Support Groups °Organization         Address  Phone             Notes  °[facility] - variety of support groups  336- 373-1402 Call for more information  °Narcotics Anonymous (NA), [facility] [address], °[place]  2 meetings at this location  ° °Residential Treatment Programs °Organization         Address  Phone  Notes  °[facility] [address],    °[place]  1-866-801-8205   °[facility] ° [address], [place], [place] 704-293-8524   °[facility] [address], [place] 336-845-3988  Admissions: 8am-3pm M-F  °[facility] [address]    °[place], [place] 336-841-1104   °[facility] [address], [place], [place] 336-379-7146   °[facility] [address]  °[place], [place] 336-285-9073   °[facility] Intensive Outpatient [address], [place], [place] 336-852-3033   °[facility] [address]  °[place], [place] 1-877-615-2722 or 336-784-9470   °[facility] [address] [place], [place] 336-227-7417 Accepts Medicaid  °[facility] [address]  °[place] 1-800-659-3381 Substance Abuse/Addiction Treatment  ° °[place] Behavioral Health Resources °Organization         Address  Phone  Notes  °[facility]  (888) 581-9988   °Julie Brannon, PhD [address] [place], [place]   (336) 349-5553 or (336) 951-0000   °[facility]   [address] °[place], [place] (336) 349-4454   °[facility] [address], [place], [place] (336) 342-8316 Insurance/Medicaid/sponsorship through [facility]  °[facility] [address]                                    [place], [place] (336) 342-8316 Therapy/tele-psych/case  °[facility] [address]  ° [place],  (336) 349-2233    °Dr. Arfeen  (336) 349-4544   °[facility] [place]  [employer] [place] [facility] 1) [address], [place] °2) [address], [place] °3)  [address], [place] (336) 349-3220 °(336) 342-7768 ° °(336) 342-8140   °[place] [facility] (336) 342-1394 or (336) 342-3537 (After Hours)    ° ° ° °

## 2016-12-17 ENCOUNTER — Ambulatory Visit (INDEPENDENT_AMBULATORY_CARE_PROVIDER_SITE_OTHER): Payer: BLUE CROSS/BLUE SHIELD

## 2016-12-17 ENCOUNTER — Ambulatory Visit (HOSPITAL_COMMUNITY)
Admission: EM | Admit: 2016-12-17 | Discharge: 2016-12-17 | Disposition: A | Discharge status: Home / Self Care | Payer: BLUE CROSS/BLUE SHIELD | Attending: Family Medicine | Admitting: Family Medicine

## 2016-12-17 ENCOUNTER — Encounter (HOSPITAL_COMMUNITY): Payer: Self-pay | Admitting: Family Medicine

## 2016-12-17 DIAGNOSIS — J111 Influenza due to unidentified influenza virus with other respiratory manifestations: Secondary | ICD-10-CM

## 2016-12-17 NOTE — ED Triage Notes (Signed)
Pt here for flu like symptoms. Reports that she was dx with flu type A on the 1st of november. Reports she is not getting better.

## 2016-12-17 NOTE — ED Provider Notes (Signed)
[facility]    CSN: [id number] Arrival date & time: 12/17/16  1056     History   Chief Complaint Chief Complaint  Patient presents with  . Generalized Body Aches  . Fever  . Chills    HPI Shelby Branch is a 26 y.o. female.   Overall healthy with competent immune system, was diagnosed with influenza A 7 days ago and was treated with Tamiflu, presents today complaining that her symptoms are not improving. She reports fever, chill, and generalized body aches. She also reports new onset of mild shortness of breath. She is alternating between ibuprofen and tylenol at home. She is able to get rest and is keeping herself well hydrated. Family member is concern for pneumonia. She denies congestion, sore throat, rash, coughing, CP, N/V/D, abdominal pain or syncope.     Past Medical History:  Diagnosis Date  . ADHD (attention deficit hyperactivity disorder)   . Anxiety   . Asthma   . Depression   . Headache(784.0)   . Seizures [facility](HCC)     Patient Active Problem List   Diagnosis Date Noted  . Opioid dependence (HCC) 09/27/2012  . Seizure (HCC) 03/30/2011  . Tobacco use disorder 03/30/2011  . ANXIETY 05/02/2008  . DEPRESSION 05/02/2008  . COMMON MIGRAINE 05/02/2008  . SEIZURE DISORDER 05/02/2008    Past Surgical History:  Procedure Laterality Date  . WISDOM TOOTH EXTRACTION      OB History    No data available       Home Medications    Prior to Admission medications   Medication Sig Start Date End Date Taking? Authorizing Provider  amphetamine-dextroamphetamine (ADDERALL XR) 30 MG 24 hr capsule Take 30 mg by mouth 2 (two) times daily as needed (focus).     [provider]  ibuprofen (ADVIL,MOTRIN) 800 MG tablet Take 1 tablet (800 mg total) by mouth every 8 (eight) hours as needed. Patient not taking: Reported on 01/15/2015 09/07/14   Evangeline DakinBeers, Charles M, PA-C  medroxyPROGESTERone (PROVERA) 5 MG tablet Take 5 mg by mouth as directed. Take for 5 days when  on menstrual cycle    [provider]  permethrin (ELIMITE) 5 % cream Apply to affected area once, from chin to toes. Leave 8 hours then shower.  Repeat treatment in one week. Patient not taking: Reported on 09/21/2014 02/07/14   Ria ClockPresson, Jennifer Lee H, PA  sulfamethoxazole-trimethoprim (BACTRIM DS,SEPTRA DS) 800-160 MG per tablet Take 1 tablet by mouth 2 (two) times daily. Patient not taking: Reported on 01/15/2015 09/07/14   Evangeline DakinBeers, Charles M, PA-C  traZODone (DESYREL) 50 MG tablet Take 1 tablet (50 mg total) by mouth at bedtime and may repeat dose one time if needed. For sleep Patient not taking: Reported on 01/15/2015 09/30/12   Sanjuana KavaNwoko, Agnes I, NP    Family History Family History  Problem Relation Age of Onset  . Depression Mother   . Anxiety disorder Mother     Social History Social History   Tobacco Use  . Smoking status: Current Some Day Smoker    Packs/day: 0.25    Years: 3.00    Pack years: 0.75    Types: Cigarettes  . Smokeless tobacco: Never Used  Substance Use Topics  . Alcohol use: Yes    Comment: occasionally  . Drug use: Yes    Types: Marijuana, Heroin    Comment: heroine, methadone     Allergies   Bee venom   Review of Systems Review of Systems  Constitutional:  Positive for chills, fatigue and fever.  HENT: Negative for congestion, ear pain, sneezing and sore throat.   Respiratory: Positive for shortness of breath. Negative for cough.   Cardiovascular: Negative for chest pain and palpitations.  Gastrointestinal: Negative for abdominal pain.  Neurological: Positive for headaches. Negative for dizziness and weakness.     Physical Exam Triage Vital Signs ED Triage Vitals  Enc Vitals Group     BP 12/17/16 1130 126/86     Pulse Rate 12/17/16 1130 (!) 102     Resp 12/17/16 1130 18     Temp 12/17/16 1130 99 F (37.2 C)     Temp src --      SpO2 12/17/16 1130 99 %     Weight --      Height --      Head Circumference --      Peak Flow --       Pain Score 12/17/16 1131 9     Pain Loc --      Pain Edu? --      Excl. in GC? --    No data found.  Updated Vital Signs BP 126/86   Pulse (!) 102   Temp 99 F (37.2 C)   Resp 18   SpO2 99%   Physical Exam  Constitutional: She is oriented to person, place, and time. She appears well-developed. No distress.  Appears somewhat exhausted but no distress  HENT:  Head: Normocephalic and atraumatic.  Right Ear: External ear normal.  Left Ear: External ear normal.  Mouth/Throat: Oropharynx is clear and moist. No oropharyngeal exudate.  Eyes: Conjunctivae are normal. Pupils are equal, round, and reactive to light.  Neck: Normal range of motion.  Cardiovascular: Normal rate, regular rhythm and normal heart sounds.  No murmur heard. Pulmonary/Chest: Effort normal and breath sounds normal. No respiratory distress. She has no wheezes.  Abdominal: Soft. Bowel sounds are normal. There is no tenderness.  Lymphadenopathy:    She has no cervical adenopathy.  Neurological: She is alert and oriented to person, place, and time.  Skin: Skin is dry. No rash noted. She is not diaphoretic.  Psychiatric: She has a normal mood and affect.  Nursing note and vitals reviewed.    UC Treatments / Results  Labs (all labs ordered are listed, but only abnormal results are displayed) Labs Reviewed - No data to display  EKG  EKG Interpretation None      Radiology No results found.  Procedures Procedures (including critical care time)  Medications Ordered in UC Medications - No data to display   Initial Impression / Assessment and Plan / UC Course  I have reviewed the triage vital signs and the nursing notes.  Pertinent labs & imaging results that were available during my care of the patient were reviewed by me and considered in my medical decision making (see chart for details).  Final Clinical Impressions(s) / UC Diagnoses   Final diagnoses:  None   Physical examination  unremarkable. VS reassuring. She is able to tolerate oral fluid. Will proceed with chest xray to evaluate for pneumonia.   Chest xray negative for pneumonia. Educated that course of illness could last another week. Advised to continue with supportive treatments at home. Patient is well to be discharged home. She agreed with plan of care. Patient denies any questions.   ED Discharge Orders    None      Controlled Substance Prescriptions [place] Controlled Substance Registry consulted? Not Applicable   Lucia EstelleZheng, Robyn Galati,  NP 12/17/16 1239

## 2017-10-01 ENCOUNTER — Encounter (HOSPITAL_COMMUNITY): Payer: Self-pay

## 2017-10-01 ENCOUNTER — Ambulatory Visit (HOSPITAL_COMMUNITY)
Admission: EM | Admit: 2017-10-01 | Discharge: 2017-10-01 | Disposition: A | Discharge status: Home / Self Care | Payer: BLUE CROSS/BLUE SHIELD | Attending: Family Medicine | Admitting: Family Medicine

## 2017-10-01 DIAGNOSIS — M545 Low back pain, unspecified: Secondary | ICD-10-CM

## 2017-10-01 MED ORDER — HYDROCODONE-ACETAMINOPHEN 7.5-325 MG PO TABS: 1.0000 | ORAL_TABLET | Freq: Four times a day (QID) | ORAL | 0 refills | Status: DC | PRN

## 2017-10-01 MED ORDER — KETOROLAC TROMETHAMINE 60 MG/2ML IM SOLN
60.0000 mg | Freq: Once | INTRAMUSCULAR | Status: AC
  Administered 2017-10-01: 60 mg via INTRAMUSCULAR

## 2017-10-01 MED ORDER — ONDANSETRON 4 MG PO TBDP
4.0000 mg | ORAL_TABLET | Freq: Once | ORAL | Status: AC
  Administered 2017-10-01: 4 mg via ORAL

## 2017-10-01 MED ORDER — KETOROLAC TROMETHAMINE 60 MG/2ML IM SOLN
INTRAMUSCULAR | Status: AC
  Filled 2017-10-01: qty 2

## 2017-10-01 MED ORDER — CYCLOBENZAPRINE HCL 5 MG PO TABS: 5.0000 mg | ORAL_TABLET | Freq: Three times a day (TID) | ORAL | 0 refills | Status: DC | PRN

## 2017-10-01 MED ORDER — ONDANSETRON 4 MG PO TBDP
ORAL_TABLET | ORAL | Status: AC
  Filled 2017-10-01: qty 1

## 2017-10-01 NOTE — ED Provider Notes (Signed)
[facility]    CSN: [id number] Arrival date & time: 10/01/17  1854     History   Chief Complaint Chief Complaint  Patient presents with  . Back Pain    HPI Shelby Branch is a 27 y.o. female.   HPI  Patient is here with 2 to 3 days of low back pain.  No injury.  She states she bends over a lot at work.  She works in a tanning bed.  No fall, no accident, no twisting or turning.  No heavy lifting.  No prior problems with back pain or disc disease.  She is been taking over-the-counter ibuprofen.  She took 1 of her mother's hydrocodone because of the pain was severe last night.  She has some nausea.  She thinks it is from the pain.  No vomiting.  No fever.  No urinary symptoms.  No cough cold or runny nose.  No numbness or weakness in the legs.  No radiation of pain.  She describes the pain is severe, bilateral, and points to her lumbar muscles.  Past Medical History:  Diagnosis Date  . ADHD (attention deficit hyperactivity disorder)   . Anxiety   . Asthma   . Depression   . Headache(784.0)   . Seizures [facility])     Patient Active Problem List   Diagnosis Date Noted  . Opioid dependence (HCC) 09/27/2012  . Seizure (HCC) 03/30/2011  . Tobacco use disorder 03/30/2011  . ANXIETY 05/02/2008  . DEPRESSION 05/02/2008  . COMMON MIGRAINE 05/02/2008  . SEIZURE DISORDER 05/02/2008    Past Surgical History:  Procedure Laterality Date  . WISDOM TOOTH EXTRACTION      OB History   None      Home Medications    Prior to Admission medications   Medication Sig Start Date End Date Taking? Authorizing Provider  amphetamine-dextroamphetamine (ADDERALL XR) 30 MG 24 hr capsule Take 30 mg by mouth 2 (two) times daily as needed (focus).    Yes [provider]  metFORMIN (GLUCOPHAGE) 500 MG tablet Take by mouth 2 (two) times daily with a meal.   Yes [provider]  cyclobenzaprine (FLEXERIL) 5 MG tablet Take 1 tablet (5 mg total) by mouth 3 (three) times daily  as needed for muscle spasms. 10/01/17   [name], MD  HYDROcodone-acetaminophen (NORCO) 7.5-325 MG tablet Take 1 tablet by mouth every 6 (six) hours as needed for moderate pain. 10/01/17   [name], MD    Family History Family History  Problem Relation Age of Onset  . Depression Mother   . Anxiety disorder Mother   . Autoimmune disease Mother   . Heart disease Father     Social History Social History   Tobacco Use  . Smoking status: Current Some Day Smoker    Packs/day: 0.25    Years: 3.00    Pack years: 0.75    Types: Cigarettes  . Smokeless tobacco: Never Used  Substance Use Topics  . Alcohol use: Yes    Comment: occasionally  . Drug use: Yes    Types: Marijuana, Heroin    Comment: heroine, methadone     Allergies   Bee venom   Review of Systems Review of Systems  Constitutional: Negative for chills and fever.  HENT: Negative for ear pain and sore throat.   Eyes: Negative for pain and visual disturbance.  Respiratory: Negative for cough and shortness of breath.   Cardiovascular: Negative for chest pain and palpitations.  Gastrointestinal: Positive for nausea. Negative for abdominal pain and vomiting.  Genitourinary: Negative for dysuria, flank pain and hematuria.  Musculoskeletal: Positive for back pain. Negative for arthralgias.  Skin: Negative for color change and rash.  Neurological: Negative for seizures, syncope, weakness and numbness.  All other systems reviewed and are negative.    Physical Exam Triage Vital Signs ED Triage Vitals [10/01/17 1915]  Enc Vitals Group     BP 122/71     Pulse Rate (!) 109     Resp 18     Temp 98 F (36.7 C)     Temp src      SpO2 100 %     Weight      Height      Head Circumference      Peak Flow      Pain Score 9     Pain Loc      Pain Edu?      Excl. in GC?    No data found.  Updated Vital Signs BP 122/71   Pulse (!) 109   Temp 98 F (36.7 C)   Resp 18   LMP 09/01/2017   SpO2  100%     Physical Exam  Constitutional: She appears well-developed and well-nourished. No distress.  Acutely uncomfortable.  Tearful.  Stiff guarded movements.  HENT:  Head: Normocephalic and atraumatic.  Mouth/Throat: Oropharynx is clear and moist.  Eyes: Pupils are equal, round, and reactive to light. Conjunctivae are normal.  Neck: Normal range of motion.  Cardiovascular: Normal rate, regular rhythm and normal heart sounds.  Pulmonary/Chest: Effort normal and breath sounds normal. No respiratory distress.  Abdominal: Soft. Bowel sounds are normal. She exhibits no distension.  Musculoskeletal: Normal range of motion. She exhibits no edema.  Neurological: She is alert.  Skin: Skin is warm and dry.     UC Treatments / Results  Labs (all labs ordered are listed, but only abnormal results are displayed) Labs Reviewed - No data to display  EKG None  Radiology No results found.  Procedures Procedures (including critical care time)  Medications Ordered in UC Medications  ketorolac (TORADOL) injection 60 mg (60 mg Intramuscular Given 10/01/17 2009)  ondansetron (ZOFRAN-ODT) disintegrating tablet 4 mg (4 mg Oral Given 10/01/17 2008)    Initial Impression / Assessment and Plan / UC Course  I have reviewed the triage vital signs and the nursing notes.  Pertinent labs & imaging results that were available during my care of the patient were reviewed by me and considered in my medical decision making (see chart for details).     Discussed the problems with giving pain medicine to patient with opioid addiction in past. Patient and sig other state she is in recovery with no use in "years"  The [place] database confirms no RX meds.   Final Clinical Impressions(s) / UC Diagnoses   Final diagnoses:  Acute bilateral low back pain without sciatica     Discharge Instructions     Take ibuprofen 6 to 800 mg 3 times a day with food for mild to moderate pain. Take hydrocodone for severe  pain.  Caution drowsiness.  Caution constipation. Take Flexeril (cyclobenzaprine) as muscle relaxer.  Cautioned drowsiness. Rest.  Stay off your feet for a couple of days.  Activity as tolerated. Off work until Monday Return if you are worse instead of better at any time   ED Prescriptions    Medication Sig Dispense Auth. Provider   HYDROcodone-acetaminophen (NORCO) 7.5-325 MG tablet  Take 1 tablet by mouth every 6 (six) hours as needed for moderate pain. 20 tablet Eustace MooreNelson, Rea Reser Sue, MD   cyclobenzaprine (FLEXERIL) 5 MG tablet Take 1 tablet (5 mg total) by mouth 3 (three) times daily as needed for muscle spasms. 30 tablet Eustace MooreNelson, Wood Novacek Sue, MD     Controlled Substance Prescriptions [place] Controlled Substance Registry consulted? Yes, I have consulted the [place] Controlled Substances Registry for this patient, and feel the risk/benefit ratio today is favorable for proceeding with this prescription for a controlled substance.   Eustace MooreNelson, Shakeria Robinette Sue, MD 10/01/17 2104

## 2017-10-01 NOTE — ED Triage Notes (Signed)
Pt presents with complaints of middle back pain. Reports doing a lot of bending at work. Pt ambulatory.

## 2017-10-01 NOTE — Discharge Instructions (Signed)
Take ibuprofen 6 to 800 mg 3 times a day with food for mild to moderate pain. Take hydrocodone for severe pain.  Caution drowsiness.  Caution constipation. Take Flexeril (cyclobenzaprine) as muscle relaxer.  Cautioned drowsiness. Rest.  Stay off your feet for a couple of days.  Activity as tolerated. Off work until Monday Return if you are worse instead of better at any time

## 2017-10-03 ENCOUNTER — Encounter (HOSPITAL_COMMUNITY): Payer: Self-pay | Admitting: *Deleted

## 2017-10-03 ENCOUNTER — Emergency Department (HOSPITAL_COMMUNITY)
Admission: EM | Admit: 2017-10-03 | Discharge: 2017-10-03 | Disposition: A | Discharge status: Home / Self Care | Payer: BLUE CROSS/BLUE SHIELD | Attending: Emergency Medicine | Admitting: Emergency Medicine

## 2017-10-03 ENCOUNTER — Emergency Department (HOSPITAL_COMMUNITY): Payer: BLUE CROSS/BLUE SHIELD

## 2017-10-03 ENCOUNTER — Other Ambulatory Visit: Payer: Self-pay

## 2017-10-03 DIAGNOSIS — N12 Tubulo-interstitial nephritis, not specified as acute or chronic: Secondary | ICD-10-CM

## 2017-10-03 DIAGNOSIS — Z79899 Other long term (current) drug therapy: Secondary | ICD-10-CM | POA: Insufficient documentation

## 2017-10-03 DIAGNOSIS — F17210 Nicotine dependence, cigarettes, uncomplicated: Secondary | ICD-10-CM | POA: Diagnosis not present

## 2017-10-03 DIAGNOSIS — J45909 Unspecified asthma, uncomplicated: Secondary | ICD-10-CM | POA: Diagnosis not present

## 2017-10-03 DIAGNOSIS — R109 Unspecified abdominal pain: Secondary | ICD-10-CM | POA: Diagnosis present

## 2017-10-03 DIAGNOSIS — F909 Attention-deficit hyperactivity disorder, unspecified type: Secondary | ICD-10-CM | POA: Diagnosis not present

## 2017-10-03 DIAGNOSIS — N10 Acute tubulo-interstitial nephritis: Secondary | ICD-10-CM | POA: Diagnosis not present

## 2017-10-03 HISTORY — DX: Polycystic ovarian syndrome: E28.2

## 2017-10-03 LAB — URINALYSIS, ROUTINE W REFLEX MICROSCOPIC
Bilirubin Urine: NEGATIVE
Glucose, UA: NEGATIVE mg/dL
Ketones, ur: NEGATIVE mg/dL
NITRITE: NEGATIVE
Protein, ur: 30 mg/dL — AB
RBC / HPF: >50 RBC/hpf — ABNORMAL HIGH (ref 0–5)
SPECIFIC GRAVITY, URINE: 1.016 (ref 1.005–1.030)
WBC, UA: >50 WBC/hpf — ABNORMAL HIGH (ref 0–5)
pH: 5 (ref 5.0–8.0)

## 2017-10-03 LAB — COMPREHENSIVE METABOLIC PANEL
ALBUMIN: 3.3 g/dL — AB (ref 3.5–5.0)
ALT: 16 U/L (ref 0–44)
AST: 21 U/L (ref 15–41)
Alkaline Phosphatase: 84 U/L (ref 38–126)
Anion gap: 7 (ref 5–15)
BILIRUBIN TOTAL: 0.3 mg/dL (ref 0.3–1.2)
BUN: 10 mg/dL (ref 6–20)
CHLORIDE: 103 mmol/L (ref 98–111)
CO2: 27 mmol/L (ref 22–32)
Calcium: 9 mg/dL (ref 8.9–10.3)
Creatinine, Ser: 1.02 mg/dL — ABNORMAL HIGH (ref 0.44–1.00)
GFR calc Af Amer: >60 mL/min (ref >60)
GLUCOSE: 117 mg/dL — AB (ref 70–99)
Potassium: 4 mmol/L (ref 3.5–5.1)
Sodium: 137 mmol/L (ref 135–145)
Total Protein: 6.3 g/dL — ABNORMAL LOW (ref 6.5–8.1)

## 2017-10-03 LAB — CBC WITH DIFFERENTIAL/PLATELET
Abs Immature Granulocytes: 0.1 10*3/uL (ref 0.0–0.1)
Basophils Absolute: 0 10*3/uL (ref 0.0–0.1)
Basophils Relative: 0 %
EOS ABS: 0.1 10*3/uL (ref 0.0–0.7)
EOS PCT: 1 %
HEMATOCRIT: 33.6 % — AB (ref 36.0–46.0)
HEMOGLOBIN: 10.6 g/dL — AB (ref 12.0–15.0)
Immature Granulocytes: 1 %
LYMPHS ABS: 1.5 10*3/uL (ref 0.7–4.0)
LYMPHS PCT: 11 %
MCH: 29.5 pg (ref 26.0–34.0)
MCHC: 31.5 g/dL (ref 30.0–36.0)
MCV: 93.6 fL (ref 78.0–100.0)
MONO ABS: 2 10*3/uL — AB (ref 0.1–1.0)
Monocytes Relative: 14 %
Neutro Abs: 10.6 10*3/uL — ABNORMAL HIGH (ref 1.7–7.7)
Neutrophils Relative %: 73 %
Platelets: 247 10*3/uL (ref 150–400)
RBC: 3.59 MIL/uL — AB (ref 3.87–5.11)
RDW: 11.7 % (ref 11.5–15.5)
WBC: 14.3 10*3/uL — AB (ref 4.0–10.5)

## 2017-10-03 LAB — PREGNANCY, URINE: PREG TEST UR: NEGATIVE

## 2017-10-03 MED ORDER — SODIUM CHLORIDE 0.9 % IV SOLN
1.0000 g | Freq: Once | INTRAVENOUS | Status: AC
  Administered 2017-10-03: 1 g via INTRAVENOUS
  Filled 2017-10-03: qty 10

## 2017-10-03 MED ORDER — KETOROLAC TROMETHAMINE 15 MG/ML IJ SOLN
15.0000 mg | Freq: Once | INTRAMUSCULAR | Status: AC
  Administered 2017-10-03: 15 mg via INTRAVENOUS
  Filled 2017-10-03: qty 1

## 2017-10-03 MED ORDER — SODIUM CHLORIDE 0.9 % IV BOLUS
1000.0000 mL | Freq: Once | INTRAVENOUS | Status: AC
  Administered 2017-10-03: 1000 mL via INTRAVENOUS

## 2017-10-03 MED ORDER — ACETAMINOPHEN 500 MG PO TABS
1000.0000 mg | ORAL_TABLET | Freq: Once | ORAL | Status: DC
  Filled 2017-10-03: qty 2

## 2017-10-03 MED ORDER — LACTATED RINGERS IV BOLUS: 1000.0000 mL | Freq: Once | INTRAVENOUS | Status: DC

## 2017-10-03 MED ORDER — ONDANSETRON HCL 4 MG PO TABS: 4.0000 mg | ORAL_TABLET | Freq: Three times a day (TID) | ORAL | 0 refills | Status: DC | PRN

## 2017-10-03 MED ORDER — CEPHALEXIN 500 MG PO CAPS: 500.0000 mg | ORAL_CAPSULE | Freq: Four times a day (QID) | ORAL | 0 refills | Status: AC

## 2017-10-03 NOTE — Discharge Instructions (Addendum)
Continue taking home medications as previously prescribed.  May take Tylenol/ibuprofen as needed for pain following dosing instructions on packaging.  Take all antibiotics as prescribed for the full 10 days.  Return to the emergency department if symptoms worsen, worsening pain, fevers, chills or other concerning symptoms.

## 2017-10-03 NOTE — ED Triage Notes (Signed)
Pt was seen at urgent care for upper back pain. Now c/o lower back pain for the past 4 days with foul odor urine. Reports taking prescription pain medication without relief of pain

## 2017-10-03 NOTE — ED Notes (Signed)
Patient verbalizes understanding of discharge instructions. Opportunity for questioning and answers were provided. Armband removed by staff, pt discharged from ED.  

## 2017-10-03 NOTE — ED Provider Notes (Signed)
[facility] Provider Note   CSN: [id number] Arrival date & time: 10/03/17  1907   History   Chief Complaint Chief Complaint  Patient presents with  . Back Pain    HPI Shelby Branch is a 27 y.o. female.  HPI Patient is a 27 year old female with history of ADHD, anxiety, depression, PCOS, seizures, and former IV drug abuser with no IV drug use in the past 2 years who presents to the emergency department today for evaluation of bilateral back/flank pain.  States that pain started about 4 days ago.  Was seen by urgent care on 10/01/2017 and diagnosed with musculoskeletal back pain.  Discharged with hydrocodone, Flexeril, ibuprofen.  These medications have not been helping.  She has not tolerated hydrocodone due to nausea.  States that pain was initially in the bilateral upper back and has now migrated more to the lower back/flanks bilaterally and is worse on the left.  She states that she thinks she has a kidney infection though has never had pyelonephritis in the past.  Describes dark, cloudy, foul-smelling urine.  No vaginal bleeding or discharge.  Is sexually active with one female partner over the past couple years and in a monogamous relationship.  Does have a history of chlamydia in 2012 but no other history of STIs.  Patient denies any midline back pain.  No numbness or weakness.  No fevers or chills.  No nausea, vomiting or diarrhea.  No falls or traumas.  No saddle anesthesia or gait problems. No urinary retention or incontinence of bowel/bladder.   Past Medical History:  Diagnosis Date  . ADHD (attention deficit hyperactivity disorder)   . Anxiety   . Asthma   . Depression   . Headache(784.0)   . PCOS (polycystic ovarian syndrome)   . Seizures [facility](HCC)     Patient Active Problem List   Diagnosis Date Noted  . Opioid dependence (HCC) 09/27/2012  . Seizure (HCC) 03/30/2011  . Tobacco use disorder 03/30/2011  . ANXIETY 05/02/2008  . DEPRESSION  05/02/2008  . COMMON MIGRAINE 05/02/2008  . SEIZURE DISORDER 05/02/2008    Past Surgical History:  Procedure Laterality Date  . WISDOM TOOTH EXTRACTION       OB History   None      Home Medications    Prior to Admission medications   Medication Sig Start Date End Date Taking? Authorizing Provider  amphetamine-dextroamphetamine (ADDERALL XR) 30 MG 24 hr capsule Take 30 mg by mouth 2 (two) times daily as needed (focus).     [provider]  cephALEXin (KEFLEX) 500 MG capsule Take 1 capsule (500 mg total) by mouth 4 (four) times daily for 10 days. 10/03/17 10/13/17  Rigoberto Noelickens, Malaak Stach, MD  cyclobenzaprine (FLEXERIL) 5 MG tablet Take 1 tablet (5 mg total) by mouth 3 (three) times daily as needed for muscle spasms. 10/01/17   Eustace MooreNelson, Yvonne Sue, MD  HYDROcodone-acetaminophen (NORCO) 7.5-325 MG tablet Take 1 tablet by mouth every 6 (six) hours as needed for moderate pain. 10/01/17   Eustace MooreNelson, Yvonne Sue, MD  metFORMIN (GLUCOPHAGE) 500 MG tablet Take by mouth 2 (two) times daily with a meal.    [provider]  ondansetron (ZOFRAN) 4 MG tablet Take 1 tablet (4 mg total) by mouth every 8 (eight) hours as needed for nausea or vomiting. 10/03/17   Rigoberto Noelickens, Jahmir Salo, MD    Family History Family History  Problem Relation Age of Onset  . Depression Mother   . Anxiety disorder Mother   .  Autoimmune disease Mother   . Heart disease Father     Social History Social History   Tobacco Use  . Smoking status: Current Some Day Smoker    Packs/day: 0.25    Years: 3.00    Pack years: 0.75    Types: Cigarettes  . Smokeless tobacco: Never Used  Substance Use Topics  . Alcohol use: Yes    Comment: occasionally  . Drug use: Yes    Types: Marijuana, Heroin    Comment: heroine, methadone     Allergies   Bee venom   Review of Systems Review of Systems  Constitutional: Negative for chills and fever.  HENT: Negative for congestion and sore throat.   Respiratory: Negative for cough  and shortness of breath.   Cardiovascular: Negative for chest pain and leg swelling.  Gastrointestinal: Negative for abdominal pain, diarrhea, nausea and vomiting.  Genitourinary: Positive for dysuria and flank pain (bilat flank, worse on left). Negative for hematuria, pelvic pain, vaginal bleeding and vaginal discharge.  Musculoskeletal: Positive for back pain. Negative for gait problem and neck pain.  Skin: Negative for color change and rash.  Neurological: Negative for weakness, numbness and headaches.  All other systems reviewed and are negative.    Physical Exam Updated Vital Signs BP 104/69   Pulse 99   Temp 98 F (36.7 C)   Resp 18   LMP 09/03/2017 Comment: neg preg test  SpO2 100%   Physical Exam  Constitutional: She appears well-developed and well-nourished. No distress.  HENT:  Head: Normocephalic and atraumatic.  Eyes: Conjunctivae are normal.  Neck: Neck supple.  Cardiovascular: Regular rhythm and intact distal pulses.  Pulmonary/Chest: Effort normal and breath sounds normal. No respiratory distress.  Abdominal: Soft. She exhibits no distension. There is no tenderness. There is no rebound and no guarding.  No RUQ pain. bilat CVA ttp, worse on left. No rash overlying these regions.   Musculoskeletal: She exhibits no edema.  No reproducible CTL spine pain  Neurological: She is alert.  Skin: Skin is warm and dry. Capillary refill takes less than 2 seconds. No pallor.  Psychiatric: She has a normal mood and affect.  Nursing note and vitals reviewed.    ED Treatments / Results  Labs (all labs ordered are listed, but only abnormal results are displayed) Labs Reviewed  URINALYSIS, ROUTINE W REFLEX MICROSCOPIC - Abnormal; Notable for the following components:      Result Value   APPearance HAZY (*)    Hgb urine dipstick LARGE (*)    Protein, ur 30 (*)    Leukocytes, UA LARGE (*)    RBC / HPF >50 (*)    WBC, UA >50 (*)    Bacteria, UA RARE (*)    All other  components within normal limits  COMPREHENSIVE METABOLIC PANEL - Abnormal; Notable for the following components:   Glucose, Bld 117 (*)    Creatinine, Ser 1.02 (*)    Total Protein 6.3 (*)    Albumin 3.3 (*)    All other components within normal limits  CBC WITH DIFFERENTIAL/PLATELET - Abnormal; Notable for the following components:   WBC 14.3 (*)    RBC 3.59 (*)    Hemoglobin 10.6 (*)    HCT 33.6 (*)    Neutro Abs 10.6 (*)    Monocytes Absolute 2.0 (*)    All other components within normal limits  URINE CULTURE  PREGNANCY, URINE    EKG None  Radiology Ct Renal Stone Study  Result Date:  10/03/2017 CLINICAL DATA:  Hematuria. EXAM: CT ABDOMEN AND PELVIS WITHOUT CONTRAST TECHNIQUE: Multidetector CT imaging of the abdomen and pelvis was performed following the standard protocol without IV contrast. COMPARISON:  None. FINDINGS: Lower chest: No acute abnormality. Hepatobiliary: No focal liver abnormality is seen. No gallstones, gallbladder wall thickening, or biliary dilatation. Pancreas: Unremarkable. No pancreatic ductal dilatation or surrounding inflammatory changes. Spleen: Normal in size without focal abnormality. Adrenals/Urinary Tract: Adrenal glands appear normal. No renal stone or hydronephrosis bilaterally. Inflammatory stranding within the LEFT perinephric space. Bladder appears normal, partially decompressed. Stomach/Bowel: No dilated large or small bowel loops. No bowel wall thickening or evidence of bowel wall inflammation seen. Moderate amount of stool and gas throughout the nondistended colon. Stomach is unremarkable. Appendix is normal. Vascular/Lymphatic: No significant vascular findings are present. No enlarged abdominal or pelvic lymph nodes identified. Reproductive: Uterus and bilateral adnexa are unremarkable. Other: No abscess collection seen.  No free intraperitoneal air. Musculoskeletal: No acute or suspicious osseous finding. IMPRESSION: LEFT perinephric inflammation  suggesting acute pyelonephritis. No renal or ureteral calculi identified. Bladder is unremarkable. Electronically Signed   By: Bary Richard M.D.   On: 10/03/2017 22:16    Procedures Procedures (including critical care time)  Medications Ordered in ED Medications  acetaminophen (TYLENOL) tablet 1,000 mg (has no administration in time range)  ketorolac (TORADOL) 15 MG/ML injection 15 mg (15 mg Intravenous Given 10/03/17 2120)  sodium chloride 0.9 % bolus 1,000 mL (1,000 mLs Intravenous New Bag/Given 10/03/17 2127)  cefTRIAXone (ROCEPHIN) 1 g in sodium chloride 0.9 % 100 mL IVPB (1 g Intravenous New Bag/Given 10/03/17 2135)     Initial Impression / Assessment and Plan / ED Course  I have reviewed the triage vital signs and the nursing notes.  Pertinent labs & imaging results that were available during my care of the patient were reviewed by me and considered in my medical decision making (see chart for details).    Patient is a 27 year old female with history of ADHD, anxiety, depression, PCOS, seizures, and former IV drug abuser with no IV drug use in the past 2 years who presents to the emergency department today for evaluation of bilateral back/flank pain.   Patient afebrile, hemodynamically stable at presentation.  Initial heart rate of 115.  Improved in the room after IV fluids.  She has no midline back pain.  No numbness, weakness, saddle anesthesia or findings to suggest cauda equina.  She has not used IV drugs in over 2 years.  No findings to suggets spinal epidural abscess at this time.  Pain is more in the flanks bilaterally and worse on the left.  Highest concern for pyelonephritis given symptoms of urinary tract infection.  Also on differential is nephrolithiasis.  No right lower quadrant pain or findings to suggest appendicitis or cholecystitis.  Patient's labs reviewed with CMP that is unremarkable.  Does have leukocytosis of 14.3 and hemoglobin of 10.6.  Urinalysis remarkable for  50 leukocytes.  Also with blood in the urine.  Patient has worse pain on the left flank.  After discussing risk/benefits patient would like CT scan to evaluate for kidney stone.  CT scan reviewed that shows left-sided perinephric stranding consistent with pyelonephritis.  No nephrolithiasis.  No hydronephrosis. No abscess.  No other acute intra-abdominal or pelvic findings.  Has no pelvic pain to suggest ovarian torsion in the setting of PCOS.  Patient will be discharged on prolonged course of Keflex for pyelonephritis.  IM Toradol given in the ED as well  as Tylenol.  Has prescription for hydrocodone at home prescribed from urgent care earlier this week.  Symptomatic management discussed.  Strict return precautions provided.  Stable at discharge.   Case and plan of care discussed with Dr. Anitra Lauth.  Final Clinical Impressions(s) / ED Diagnoses   Final diagnoses:  Pyelonephritis    ED Discharge Orders         Ordered    cephALEXin (KEFLEX) 500 MG capsule  4 times daily     10/03/17 2222    ondansetron (ZOFRAN) 4 MG tablet  Every 8 hours PRN     10/03/17 2223           [name], MD 10/03/17 2227    Gwyneth Sprout, MD 10/04/17 0010

## 2017-10-05 LAB — URINE CULTURE: Culture: <10000 — AB

## 2017-12-09 ENCOUNTER — Emergency Department (HOSPITAL_COMMUNITY)
Admission: EM | Admit: 2017-12-09 | Discharge: 2017-12-09 | Disposition: A | Discharge status: Home / Self Care | Payer: BLUE CROSS/BLUE SHIELD | Attending: Emergency Medicine | Admitting: Emergency Medicine

## 2017-12-09 ENCOUNTER — Encounter (HOSPITAL_COMMUNITY): Payer: Self-pay | Admitting: Emergency Medicine

## 2017-12-09 ENCOUNTER — Other Ambulatory Visit: Payer: Self-pay

## 2017-12-09 DIAGNOSIS — F17210 Nicotine dependence, cigarettes, uncomplicated: Secondary | ICD-10-CM | POA: Diagnosis not present

## 2017-12-09 DIAGNOSIS — T401X1A Poisoning by heroin, accidental (unintentional), initial encounter: Secondary | ICD-10-CM | POA: Diagnosis not present

## 2017-12-09 DIAGNOSIS — Z79899 Other long term (current) drug therapy: Secondary | ICD-10-CM | POA: Insufficient documentation

## 2017-12-09 DIAGNOSIS — F1110 Opioid abuse, uncomplicated: Secondary | ICD-10-CM | POA: Diagnosis not present

## 2017-12-09 DIAGNOSIS — J45909 Unspecified asthma, uncomplicated: Secondary | ICD-10-CM | POA: Diagnosis not present

## 2017-12-09 NOTE — ED Notes (Signed)
Bed: ZO10 Expected date:  Expected time:  Means of arrival:  Comments: 27 yr old overdose

## 2017-12-09 NOTE — ED Provider Notes (Signed)
[facility] Provider Note: [name], MD, FACEP  CSN: [id number] MRN: [id number] ARRIVAL: 12/09/17 at 0058 ROOM: WA12/WA12   CHIEF COMPLAINT  Heroin Overdose   HISTORY OF PRESENT ILLNESS  12/09/17 1:06 AM Shelby Branch is a 27 y.o. female with a history of heroin abuse.  She states she had been clean for 2-1/2 years but relapsed.  She injected herself with heroin and was found unresponsive and near apneic by her boyfriend.  She was administered Narcan with return to normal mentation.  She complains of feeling general weakness but denies pain or nausea.  She denies suicidal intent.   Past Medical History:  Diagnosis Date  . ADHD (attention deficit hyperactivity disorder)   . Anxiety   . Asthma   . Depression   . Headache(784.0)   . PCOS (polycystic ovarian syndrome)   . Seizures (HCC)     Past Surgical History:  Procedure Laterality Date  . WISDOM TOOTH EXTRACTION      Family History  Problem Relation Age of Onset  . Depression Mother   . Anxiety disorder Mother   . Autoimmune disease Mother   . Heart disease Father     Social History   Tobacco Use  . Smoking status: Current Some Day Smoker    Packs/day: 0.25    Years: 3.00    Pack years: 0.75    Types: Cigarettes  . Smokeless tobacco: Never Used  Substance Use Topics  . Alcohol use: Yes    Comment: occasionally  . Drug use: Yes    Types: Marijuana, Heroin    Comment: heroine, methadone    Prior to Admission medications   Medication Sig Start Date End Date Taking? Authorizing Provider  amphetamine-dextroamphetamine (ADDERALL XR) 30 MG 24 hr capsule Take 30 mg by mouth 2 (two) times daily as needed (focus).     [provider]  cyclobenzaprine (FLEXERIL) 5 MG tablet Take 1 tablet (5 mg total) by mouth 3 (three) times daily as needed for muscle spasms. 10/01/17   [name], MD  HYDROcodone-acetaminophen (NORCO) 7.5-325 MG tablet Take 1 tablet by mouth every 6 (six) hours as  needed for moderate pain. 10/01/17   [name], MD  metFORMIN (GLUCOPHAGE) 500 MG tablet Take by mouth 2 (two) times daily with a meal.    [provider]  ondansetron (ZOFRAN) 4 MG tablet Take 1 tablet (4 mg total) by mouth every 8 (eight) hours as needed for nausea or vomiting. 10/03/17   [name], MD    Allergies Bee venom   REVIEW OF SYSTEMS  Negative except as noted here or in the History of Present Illness.   PHYSICAL EXAMINATION  Initial Vital Signs Blood pressure 111/75, pulse 91, temperature 97.9 F (36.6 C), temperature source Oral, resp. rate 12, SpO2 97 %.  Examination General: Well-developed, well-nourished female in no acute distress; appearance consistent with age of record HENT: normocephalic; atraumatic Eyes: pupils equal, round and reactive to light; extraocular muscles intact Neck: supple Heart: regular rate and rhythm Lungs: clear to auscultation bilaterally Abdomen: soft; nondistended; nontender; bowel sounds present Extremities: No deformity; full range of motion; pulses normal Neurologic: Awake, alert and oriented; motor function intact in all extremities and symmetric; no facial droop Skin: Warm and dry Psychiatric: Flat affect; no HI; no SI   RESULTS  Summary of this visit's results, reviewed by myself:   EKG Interpretation  Date/Time:    Ventricular Rate:    PR Interval:  QRS Duration:   QT Interval:    QTC Calculation:   R Axis:     Text Interpretation:        Laboratory Studies: No results found for this or any previous visit (from the past 24 hour(s)). Imaging Studies: No results found.  ED COURSE and MDM  Nursing notes and initial vitals signs, including pulse oximetry, reviewed.  Vitals:   12/09/17 0105 12/09/17 0136  BP: 111/75 111/74  Pulse: 91 99  Resp: 12 18  Temp: 97.9 F (36.6 C)   TempSrc: Oral   SpO2: 97% 96%   2:27 AM Patient remained stable, awake and alert.  States she is ready to go  home  PROCEDURES    ED DIAGNOSES     ICD-10-CM   1. Accidental overdose of heroin, initial encounter [facility]) T40.1X1A        Paula Libra, MD 12/09/17 [phone]

## 2017-12-09 NOTE — ED Triage Notes (Addendum)
Pt is brought in by EMS from home with c/o "heroin abuse".  Pt's boyfriend called out EMS and reported that pt was becoming unresponsive after taking heroin.  Pt was agonal on EMS' arrival at the scene---- was given Narcan IV on scene.  Pt returned to alertness after narcan.  Pt denies SI/HI.

## 2018-01-18 ENCOUNTER — Emergency Department (HOSPITAL_COMMUNITY)
Admission: EM | Admit: 2018-01-18 | Discharge: 2018-01-18 | Discharge status: Absent without leave | Payer: BLUE CROSS/BLUE SHIELD

## 2018-05-09 ENCOUNTER — Other Ambulatory Visit: Payer: Self-pay

## 2018-05-09 ENCOUNTER — Emergency Department (HOSPITAL_COMMUNITY)
Admission: EM | Admit: 2018-05-09 | Discharge: 2018-05-09 | Disposition: A | Discharge status: Home / Self Care | Payer: BLUE CROSS/BLUE SHIELD | Attending: Emergency Medicine | Admitting: Emergency Medicine

## 2018-05-09 ENCOUNTER — Emergency Department (HOSPITAL_COMMUNITY): Payer: BLUE CROSS/BLUE SHIELD

## 2018-05-09 DIAGNOSIS — T50901A Poisoning by unspecified drugs, medicaments and biological substances, accidental (unintentional), initial encounter: Secondary | ICD-10-CM

## 2018-05-09 DIAGNOSIS — F329 Major depressive disorder, single episode, unspecified: Secondary | ICD-10-CM | POA: Insufficient documentation

## 2018-05-09 DIAGNOSIS — G44319 Acute post-traumatic headache, not intractable: Secondary | ICD-10-CM

## 2018-05-09 DIAGNOSIS — F909 Attention-deficit hyperactivity disorder, unspecified type: Secondary | ICD-10-CM | POA: Insufficient documentation

## 2018-05-09 DIAGNOSIS — W19XXXA Unspecified fall, initial encounter: Secondary | ICD-10-CM

## 2018-05-09 DIAGNOSIS — F17210 Nicotine dependence, cigarettes, uncomplicated: Secondary | ICD-10-CM | POA: Diagnosis not present

## 2018-05-09 DIAGNOSIS — S01112A Laceration without foreign body of left eyelid and periocular area, initial encounter: Secondary | ICD-10-CM | POA: Diagnosis not present

## 2018-05-09 DIAGNOSIS — T401X1A Poisoning by heroin, accidental (unintentional), initial encounter: Secondary | ICD-10-CM | POA: Insufficient documentation

## 2018-05-09 DIAGNOSIS — S0990XA Unspecified injury of head, initial encounter: Secondary | ICD-10-CM | POA: Diagnosis present

## 2018-05-09 DIAGNOSIS — Y929 Unspecified place or not applicable: Secondary | ICD-10-CM | POA: Insufficient documentation

## 2018-05-09 DIAGNOSIS — F419 Anxiety disorder, unspecified: Secondary | ICD-10-CM | POA: Insufficient documentation

## 2018-05-09 DIAGNOSIS — Z7984 Long term (current) use of oral hypoglycemic drugs: Secondary | ICD-10-CM | POA: Diagnosis not present

## 2018-05-09 DIAGNOSIS — J45909 Unspecified asthma, uncomplicated: Secondary | ICD-10-CM | POA: Diagnosis not present

## 2018-05-09 DIAGNOSIS — Z79899 Other long term (current) drug therapy: Secondary | ICD-10-CM | POA: Insufficient documentation

## 2018-05-09 DIAGNOSIS — Y998 Other external cause status: Secondary | ICD-10-CM | POA: Insufficient documentation

## 2018-05-09 DIAGNOSIS — Y9389 Activity, other specified: Secondary | ICD-10-CM | POA: Insufficient documentation

## 2018-05-09 DIAGNOSIS — W01190A Fall on same level from slipping, tripping and stumbling with subsequent striking against furniture, initial encounter: Secondary | ICD-10-CM | POA: Insufficient documentation

## 2018-05-09 DIAGNOSIS — S0181XA Laceration without foreign body of other part of head, initial encounter: Secondary | ICD-10-CM

## 2018-05-09 LAB — I-STAT BETA HCG BLOOD, ED (MC, WL, AP ONLY): I-stat hCG, quantitative: <5 m[IU]/mL (ref <5)

## 2018-05-09 MED ORDER — SODIUM CHLORIDE 0.9 % IV BOLUS
1000.0000 mL | Freq: Once | INTRAVENOUS | Status: AC
  Administered 2018-05-09: 1000 mL via INTRAVENOUS

## 2018-05-09 MED ORDER — BACITRACIN ZINC 500 UNIT/GM EX OINT: TOPICAL_OINTMENT | CUTANEOUS | Status: DC | PRN

## 2018-05-09 MED ORDER — BACITRACIN ZINC 500 UNIT/GM EX OINT
TOPICAL_OINTMENT | CUTANEOUS | Status: AC
  Administered 2018-05-09: 20:00:00
  Filled 2018-05-09: qty 0.9

## 2018-05-09 MED ORDER — LIDOCAINE-EPINEPHRINE-TETRACAINE (LET) SOLUTION
3.0000 mL | Freq: Once | NASAL | Status: AC
  Administered 2018-05-09: 3 mL via TOPICAL
  Filled 2018-05-09: qty 3

## 2018-05-09 NOTE — Discharge Instructions (Signed)
After repair of your laceration, we feel you are safe for discharge home.  We suspect your accidental overdose led to the fall and head injury.  Please be careful to avoid overdose in the future and use the resources to help with this.  Please watch your laceration for signs and symptoms of infection.  Please follow-up with a primary doctor or urgent care or the emergency department in the next 10 days or so for suture removal.  If any symptoms change or worsen, please return to the nearest emergency department.  Please stay hydrated and rest.

## 2018-05-09 NOTE — ED Notes (Signed)
Assisted patient to the restroom and she ambulated with no assistance back to stretcher.

## 2018-05-09 NOTE — ED Notes (Signed)
Bed: WI09 Expected date:  Expected time:  Means of arrival:  Comments: OD/head injury

## 2018-05-09 NOTE — ED Triage Notes (Signed)
Per EMS, Pt is a probable heroin overdose (pts preferred drug), pt is not admitting what she took. Pt received 6 mg total of narcan. Pt fell and appears to have hit her head on the drawers of a chest. Pt has a apprx 2 cm lac across left eyebrow.

## 2018-05-09 NOTE — ED Provider Notes (Signed)
[facility]-EMERGENCY DEPT Provider Note   CSN: [id number] Arrival date & time: 05/09/18  1814    History   Chief Complaint No chief complaint on file. fall,  Accidental overdose, laceration   HPI Shelby Branch is a 28 y.o. female.     The history is provided by the patient, the EMS personnel and medical records (ems report to nursing).  Drug Overdose  This is a recurrent problem. The current episode started less than 1 hour ago. The problem occurs rarely. The problem has been resolved. Associated symptoms include headaches. Pertinent negatives include no chest pain, no abdominal pain and no shortness of breath. Nothing aggravates the symptoms. Nothing relieves the symptoms. Treatments tried: narcan with EMS. The treatment provided significant relief.    Past Medical History:  Diagnosis Date   ADHD (attention deficit hyperactivity disorder)    Anxiety    Asthma    Depression    Headache(784.0)    PCOS (polycystic ovarian syndrome)    Seizures (HCC)     Patient Active Problem List   Diagnosis Date Noted   Opioid dependence (HCC) 09/27/2012   Seizure (HCC) 03/30/2011   Tobacco use disorder 03/30/2011   ANXIETY 05/02/2008   DEPRESSION 05/02/2008   COMMON MIGRAINE 05/02/2008   SEIZURE DISORDER 05/02/2008    Past Surgical History:  Procedure Laterality Date   WISDOM TOOTH EXTRACTION       OB History   No obstetric history on file.      Home Medications    Prior to Admission medications   Medication Sig Start Date End Date Taking? Authorizing Provider  amphetamine-dextroamphetamine (ADDERALL XR) 30 MG 24 hr capsule Take 30 mg by mouth 2 (two) times daily as needed (focus).     [provider]  cyclobenzaprine (FLEXERIL) 5 MG tablet Take 1 tablet (5 mg total) by mouth 3 (three) times daily as needed for muscle spasms. 10/01/17   [name], MD  metFORMIN (GLUCOPHAGE) 500 MG tablet Take by mouth 2 (two) times  daily with a meal.    [provider]  ondansetron (ZOFRAN) 4 MG tablet Take 1 tablet (4 mg total) by mouth every 8 (eight) hours as needed for nausea or vomiting. 10/03/17   [name], MD    Family History Family History  Problem Relation Age of Onset   Depression Mother    Anxiety disorder Mother    Autoimmune disease Mother    Heart disease Father     Social History Social History   Tobacco Use   Smoking status: Current Some Day Smoker    Packs/day: 0.25    Years: 3.00    Pack years: 0.75    Types: Cigarettes   Smokeless tobacco: Never Used  Substance Use Topics   Alcohol use: Yes    Comment: occasionally   Drug use: Yes    Types: Marijuana, Heroin    Comment: heroine, methadone     Allergies   Bee venom   Review of Systems Review of Systems  Constitutional: Negative for chills, diaphoresis, fatigue and fever.  HENT: Negative for congestion.   Eyes: Negative for photophobia and visual disturbance.  Respiratory: Negative for cough, choking, shortness of breath, wheezing and stridor.   Cardiovascular: Negative for chest pain.  Gastrointestinal: Negative for abdominal pain, diarrhea, nausea and vomiting.  Genitourinary: Negative for dysuria and flank pain.  Musculoskeletal: Negative for back pain, neck pain and neck stiffness.  Skin: Positive for wound. Negative for rash.  Neurological:  Positive for headaches. Negative for dizziness, weakness and light-headedness.  Psychiatric/Behavioral: Negative for agitation and confusion.  All other systems reviewed and are negative.    Physical Exam Updated Vital Signs BP 111/79 (BP Location: Right Arm)    Pulse (!) 103    Temp 98 F (36.7 C) (Oral)    Resp 18    SpO2 98% Comment: Simultaneous filing. User may not have seen previous data.  Physical Exam Vitals signs and nursing note reviewed.  Constitutional:      General: She is not in acute distress.    Appearance: She is well-developed. She is  not ill-appearing, toxic-appearing or diaphoretic.  HENT:     Head: Laceration present.      Comments: Other than face laceration, no laceration seen on head.  Neck is nontender.    Right Ear: External ear normal.     Left Ear: External ear normal.     Nose: Nose normal. No congestion or rhinorrhea.     Mouth/Throat:     Mouth: Mucous membranes are moist.     Pharynx: No oropharyngeal exudate or posterior oropharyngeal erythema.  Eyes:     Conjunctiva/sclera: Conjunctivae normal.     Pupils: Pupils are equal, round, and reactive to light.  Neck:     Musculoskeletal: Normal range of motion and neck supple. No neck rigidity or muscular tenderness.  Cardiovascular:     Rate and Rhythm: Tachycardia present.  Pulmonary:     Effort: No respiratory distress.     Breath sounds: No stridor. No wheezing, rhonchi or rales.  Chest:     Chest wall: No tenderness.  Abdominal:     General: There is no distension.     Tenderness: There is no abdominal tenderness. There is no rebound.  Musculoskeletal:        General: Tenderness present.     Right lower leg: No edema.     Left lower leg: No edema.  Skin:    General: Skin is warm.     Capillary Refill: Capillary refill takes less than 2 seconds.     Findings: No erythema or rash.  Neurological:     General: No focal deficit present.     Mental Status: She is alert and oriented to person, place, and time.     Motor: No abnormal muscle tone.     Coordination: Coordination normal.     Deep Tendon Reflexes: Reflexes are normal and symmetric.  Psychiatric:        Mood and Affect: Mood normal.      ED Treatments / Results  Labs (all labs ordered are listed, but only abnormal results are displayed) Labs Reviewed  I-STAT BETA HCG BLOOD, ED ([facility], [facility], [facility] ONLY)    EKG None  Radiology Ct Head Wo Contrast  Result Date: 05/09/2018 CLINICAL DATA:  Suspected drug overdose, struck head on furniture. LEFT periorbital laceration. EXAM: CT HEAD  WITHOUT CONTRAST TECHNIQUE: Contiguous axial images were obtained from the base of the skull through the vertex without intravenous contrast. COMPARISON:  CT HEAD August 08, 2014 FINDINGS: BRAIN: No intraparenchymal hemorrhage, mass effect nor midline shift. The ventricles and sulci are normal. No acute large vascular territory infarcts. No abnormal extra-axial fluid collections. Basal cisterns are patent. VASCULAR: Unremarkable. SKULL/SOFT TISSUES: No skull fracture. Subcentimeter LEFT frontal bony excrescence from inner table seen with meningioma or hyperostosis frontalis internus. Small LEFT frontal scalp hematoma with skin defect. No radiopaque foreign bodies. ORBITS/SINUSES: The included ocular globes and orbital contents  are normal.Trace paranasal sinus mucosal thickening. Mastoid air cells are well aerated. OTHER: None. IMPRESSION: 1. No acute intracranial process. Small LEFT frontal scalp hematoma and laceration. 2. Small LEFT frontal potential meningioma without mass effect. Otherwise negative noncontrast CT HEAD. Electronically Signed   By: Awilda Metro M.D.   On: 05/09/2018 19:33    Procedures .Marland KitchenLaceration Repair Date/Time: 05/09/2018 8:33 PM Performed by: Heide Scales, MD Authorized by: Heide Scales, MD   Consent:    Consent obtained:  Verbal   Consent given by:  Patient   Risks discussed:  Pain, poor cosmetic result, poor wound healing and retained foreign body   Alternatives discussed:  No treatment Anesthesia (see MAR for exact dosages):    Anesthesia method:  Topical application   Topical anesthetic:  LET Laceration details:    Location:  Face   Face location:  L eyebrow   Length (cm):  3   Depth (mm):  3 Repair type:    Repair type:  Simple Pre-procedure details:    Preparation:  Patient was prepped and draped in usual sterile fashion Exploration:    Hemostasis achieved with:  Direct pressure   Wound exploration: wound explored through full range of  motion and entire depth of wound probed and visualized     Wound extent: no foreign bodies/material noted, no muscle damage noted, no underlying fracture noted and no vascular damage noted     Contaminated: no   Treatment:    Area cleansed with:  Saline   Amount of cleaning:  Standard   Irrigation solution:  Sterile saline   Irrigation volume:  500   Irrigation method:  Syringe   Visualized foreign bodies/material removed: no   Skin repair:    Repair method:  Sutures   Suture size:  5-0   Suture material:  Nylon   Suture technique:  Simple interrupted   Number of sutures:  7 Approximation:    Approximation:  Close Post-procedure details:    Dressing:  Antibiotic ointment and non-adherent dressing   Patient tolerance of procedure:  Tolerated well, no immediate complications   (including critical care time)  Medications Ordered in ED Medications  bacitracin ointment (has no administration in time range)  sodium chloride 0.9 % bolus 1,000 mL (0 mLs Intravenous Stopped 05/09/18 1941)  lidocaine-EPINEPHrine-tetracaine (LET) solution (3 mLs Topical Given 05/09/18 1912)  lidocaine-EPINEPHrine-tetracaine (LET) solution (3 mLs Topical Given 05/09/18 1935)  bacitracin 500 UNIT/GM ointment (  Given by Other 05/09/18 2029)     Initial Impression / Assessment and Plan / ED Course  I have reviewed the triage vital signs and the nursing notes.  Pertinent labs & imaging results that were available during my care of the patient were reviewed by me and considered in my medical decision making (see chart for details).        CARI HENNESS is a 28 y.o. female with a past medical history significant for anxiety, depression, ADHD, PCOS, seizures, asthma, and IV drug abuse who presents with accidental overdose, fall, and laceration.  Patient reports that she used her normal amount of heroin today and thinks that she actually passed out.  She reports falling and hitting the left eyebrow/forehead on the  ground causing a laceration.  Patient was brought in by EMS who gave the patient 6 mg of Narcan with resolution of her somnolence.  Patient reports she is feeling better now but has headache on her left forehead.  She denies nausea vomiting, vision changes, or  any preceding symptoms such as fevers, chills, congestion, cough.  She has no sick contacts.  She denies any other complaints.  No neck pain or neck stiffness.    On exam, patient has a 3 cm laceration to her left eyebrow on the face.  Patient has normal extraocular movements.  Patient has symmetric and reactive pupils bilaterally.  Symmetric smile.  Oropharyngeal exam unremarkable.  Lungs clear and chest is nontender.  Abdomen nontender.  No focal neurologic deficits.  Patient had no neck pain and neck tenderness with full range of motion without symptoms.  Patient had cervical immobilization collar applied when she was somnolent with evidence of a head injury.  Now that symptoms of altered status have resolved after Narcan and likely axonal overdose, patient cervical spine was reexamined and sure decision made conversation told patient and we will clear her collar at this time.  With no tenderness or pain with movement, will remove collar.  Patient reports her tetanus is up-to-date.  Patient will have CT head to look for intracranial injury or fracture.  Patient was given fluids for mild tachycardia.  She denies any palpitations or preceding chest pain or shortness of breath.  Suspect accidental overdose as cause of symptoms given the response to Narcan.  Patient will have pregnancy test, CT head, and then laceration will be repaired.  CT head showed no fracture dislocation.  Pregnancy test negative.  LET gel was applied and laceration was repaired without difficulty.  Patient will follow-up with PCP for suture removal.  Patient understands to watch for signs and symptoms of infection and is going to be careful not to overdose again.  Patient  reports she Artie has resources but more be provided for substance abuse management.  Patient was feeling better and was able to ambulate without difficulty.  Still no focal nausea deficits.  Patient well-appearing.  Patient discharged in good condition after wound management.   Final Clinical Impressions(s) / ED Diagnoses   Final diagnoses:  Accidental overdose, initial encounter  Fall, initial encounter  Injury of head, initial encounter  Acute post-traumatic headache, not intractable  Facial laceration, initial encounter    ED Discharge Orders    None      Clinical Impression: 1. Accidental overdose, initial encounter   2. Fall, initial encounter   3. Injury of head, initial encounter   4. Acute post-traumatic headache, not intractable   5. Facial laceration, initial encounter     Disposition: Discharge  Condition: Good  I have discussed the results, Dx and Tx plan with the pt(& family if present). He/she/they expressed understanding and agree(s) with the plan. Discharge instructions discussed at great length. Strict return precautions discussed and pt &/or family have verbalized understanding of the instructions. No further questions at time of discharge.    New Prescriptions   No medications on file    Follow Up: [facility] [address] [place] [address] [phone] Schedule an appointment as soon as possible for a visit    [facility]-EMERGENCY DEPT [address] [id number] [place] [address] [phone]       Anthone Prieur, Canary Brim, MD 05/09/18 2038

## 2018-06-09 ENCOUNTER — Emergency Department (HOSPITAL_COMMUNITY)
Admission: EM | Admit: 2018-06-09 | Discharge: 2018-06-09 | Disposition: A | Discharge status: Home / Self Care | Payer: BLUE CROSS/BLUE SHIELD | Attending: Emergency Medicine | Admitting: Emergency Medicine

## 2018-06-09 ENCOUNTER — Encounter (HOSPITAL_COMMUNITY): Payer: Self-pay | Admitting: *Deleted

## 2018-06-09 DIAGNOSIS — T507X1A Poisoning by analeptics and opioid receptor antagonists, accidental (unintentional), initial encounter: Secondary | ICD-10-CM | POA: Diagnosis not present

## 2018-06-09 DIAGNOSIS — Z7984 Long term (current) use of oral hypoglycemic drugs: Secondary | ICD-10-CM | POA: Diagnosis not present

## 2018-06-09 DIAGNOSIS — J45909 Unspecified asthma, uncomplicated: Secondary | ICD-10-CM | POA: Insufficient documentation

## 2018-06-09 DIAGNOSIS — F17210 Nicotine dependence, cigarettes, uncomplicated: Secondary | ICD-10-CM | POA: Diagnosis not present

## 2018-06-09 DIAGNOSIS — Z79899 Other long term (current) drug therapy: Secondary | ICD-10-CM | POA: Diagnosis not present

## 2018-06-09 DIAGNOSIS — T40601A Poisoning by unspecified narcotics, accidental (unintentional), initial encounter: Secondary | ICD-10-CM

## 2018-06-09 DIAGNOSIS — R4182 Altered mental status, unspecified: Secondary | ICD-10-CM | POA: Diagnosis present

## 2018-06-09 MED ORDER — NALOXONE HCL 0.4 MG/ML IJ SOLN
0.4000 mg | Freq: Once | INTRAMUSCULAR | Status: AC
  Administered 2018-06-09: 0.4 mg via INTRAVENOUS

## 2018-06-09 MED ORDER — SODIUM CHLORIDE 0.9 % IV BOLUS
1000.0000 mL | Freq: Once | INTRAVENOUS | Status: AC
  Administered 2018-06-09: 1000 mL via INTRAVENOUS

## 2018-06-09 MED ORDER — NALOXONE HCL 4 MG/0.1ML NA LIQD
1.0000 | Freq: Once | NASAL | Status: AC
  Administered 2018-06-09: 1 via NASAL
  Filled 2018-06-09: qty 4

## 2018-06-09 MED ORDER — ONDANSETRON HCL 4 MG/2ML IJ SOLN
4.0000 mg | Freq: Once | INTRAMUSCULAR | Status: AC
  Administered 2018-06-09: 4 mg via INTRAVENOUS
  Filled 2018-06-09: qty 2

## 2018-06-09 NOTE — Discharge Instructions (Signed)
There is help if you need it.  Please do not use dirty needles, this could cause you a severe infection to your skin, heart or spinal cord.  This could kill you or leave you permanently disabled.    There was a recent study done in this area about the mortality of opiate overdose.  It shows that if you came to the hospital for opiate overdose then your chance of dying in the next year is 12%.  [place] [facility]ioid [facility] ([facility]) Fixed; mobile; peer-based Anders Grant [phone] [email or web address] Fixed site exchange at [facility], Wednesdays (2-5pm) and Thursdays (3-8pm). [address] [place], [place] [address] Call or text to arrange mobile and peer exchange, Mondays (1-4pm) and Fridays (4-7pm). Serving [place]

## 2018-06-09 NOTE — ED Provider Notes (Addendum)
[facility]-EMERGENCY DEPT Provider Note   CSN: [id number] Arrival date & time: 06/09/18  1758    History   Chief Complaint Chief Complaint  Patient presents with  . Loss of Consciousness    HPI Shelby Branch is a 28 y.o. female.     28 yo F with a chief complaints of altered mental status.  Per the patient's fianc she was found unresponsive on the bed.  He quickly put her into the car and drove her here.  Patient was not sufficiently breathing and so was bagged in route to the room.  Level 5 caveat altered mental status.  Per the fianc the patient has had issues with drug abuse in the past and has previously done heroin and had overdoses.  She also has a remote history of seizure-like activity that was thought to be benzodiazepine withdrawal.  He states that she had a recent loss of pregnancy and this has hit her heart and led her to maybe relapse.  He did not state specifically if she had done drugs today.  The history is provided by the patient and the spouse.  Illness  Severity:  Mild Onset quality:  Gradual Duration:  1 hour Timing:  Constant Progression:  Unchanged Chronicity:  New Associated symptoms: no chest pain, no congestion, no fever, no headaches, no myalgias, no nausea, no rhinorrhea, no shortness of breath, no vomiting and no wheezing     Past Medical History:  Diagnosis Date  . ADHD (attention deficit hyperactivity disorder)   . Anxiety   . Asthma   . Depression   . Headache(784.0)   . PCOS (polycystic ovarian syndrome)   . Seizures (HCC)     Patient Active Problem List   Diagnosis Date Noted  . Opioid dependence (HCC) 09/27/2012  . Seizure (HCC) 03/30/2011  . Tobacco use disorder 03/30/2011  . ANXIETY 05/02/2008  . DEPRESSION 05/02/2008  . COMMON MIGRAINE 05/02/2008  . SEIZURE DISORDER 05/02/2008    Past Surgical History:  Procedure Laterality Date  . WISDOM TOOTH EXTRACTION       OB History   No obstetric history  on file.      Home Medications    Prior to Admission medications   Medication Sig Start Date End Date Taking? Authorizing Provider  ALPRAZolam (XANAX) 1 MG tablet Take 1 mg by mouth daily. 04/28/18   [provider]  amphetamine-dextroamphetamine (ADDERALL XR) 30 MG 24 hr capsule Take 30 mg by mouth 2 (two) times daily as needed (focus).     [provider]  cyclobenzaprine (FLEXERIL) 5 MG tablet Take 1 tablet (5 mg total) by mouth 3 (three) times daily as needed for muscle spasms. Patient not taking: Reported on 05/09/2018 10/01/17   Nelson, Yvonne Sue, MD  diphenhydrAMINE (BENADRYL) 25 MG tablet Take 25 mg by mouth daily as needed for allergies.    [provider]  metFORMIN (GLUCOPHAGE) 1000 MG tablet Take 1,000 mg by mouth 2 (two) times daily. 05/07/18   [provider]  ondansetron (ZOFRAN) 4 MG tablet Take 1 tablet (4 mg total) by mouth every 8 (eight) hours as needed for nausea or vomiting. Patient not taking: Reported on 05/09/2018 10/03/17   Dickens, Brett, MD    Family History Family History  Problem Relation Age of Onset  . Depression Mother   . Anxiety disorder Mother   . Autoimmune disease Mother   . Heart disease Father     Social History Social History     Tobacco Use  . Smoking status: Current Some Day Smoker    Packs/day: 0.25    Years: 3.00    Pack years: 0.75    Types: Cigarettes  . Smokeless tobacco: Never Used  Substance Use Topics  . Alcohol use: Yes    Comment: occasionally  . Drug use: Yes    Types: Marijuana, Heroin    Comment: heroine, methadone     Allergies   Bee venom   Review of Systems Review of Systems  Unable to perform ROS: Mental status change  Constitutional: Negative for chills and fever.  HENT: Negative for congestion and rhinorrhea.   Eyes: Negative for redness and visual disturbance.  Respiratory: Negative for shortness of breath and wheezing.   Cardiovascular: Negative for chest pain and  palpitations.  Gastrointestinal: Negative for nausea and vomiting.  Genitourinary: Negative for dysuria and urgency.  Musculoskeletal: Negative for arthralgias and myalgias.  Skin: Negative for pallor and wound.  Neurological: Negative for dizziness and headaches.     Physical Exam Updated Vital Signs BP 121/83   Pulse (!) 121   Resp (!) 22   SpO2 100%   Physical Exam Vitals signs and nursing note reviewed.  Constitutional:      General: She is not in acute distress.    Appearance: She is well-developed. She is not diaphoretic.  HENT:     Head: Normocephalic and atraumatic.  Eyes:     Pupils: Pupils are equal, round, and reactive to light.  Neck:     Musculoskeletal: Normal range of motion and neck supple.  Cardiovascular:     Rate and Rhythm: Normal rate and regular rhythm.     Heart sounds: No murmur. No friction rub. No gallop.   Pulmonary:     Effort: Pulmonary effort is normal.     Breath sounds: No wheezing or rales.  Abdominal:     General: There is no distension.     Palpations: Abdomen is soft.     Tenderness: There is no abdominal tenderness.  Musculoskeletal:        General: No tenderness.  Skin:    General: Skin is warm and dry.  Neurological:     GCS: GCS eye subscore is 1. GCS verbal subscore is 1. GCS motor subscore is 5.      ED Treatments / Results  Labs (all labs ordered are listed, but only abnormal results are displayed) Labs Reviewed - No data to display  EKG EKG Interpretation  Date/Time:  Thursday June 09 2018 18:04:36 EDT Ventricular Rate:  145 PR Interval:    QRS Duration: 89 QT Interval:  312 QTC Calculation: 485 R Axis:   92 Text Interpretation:  Atrial fibrillation Borderline right axis deviation Minimal ST depression, inferior leads Borderline prolonged QT interval No significant change since last tracing Confirmed by Floyd, Dan ([id number]) on 06/09/2018 6:15:01 PM   Radiology No results found.  Procedures Procedures  (including critical care time) EJ placement: 18 gauge IV placed in L EJ. Skin prepped with alcohol pads, L EJ identified with Valsalva. Cannulated with good return of dark, non-pulsatile blood. Tachyderm placed after easily flushed with NS.  Medications Ordered in ED Medications  naloxone (NARCAN) nasal spray 4 mg/0.1 mL (has no administration in time range)  sodium chloride 0.9 % bolus 1,000 mL (1,000 mLs Intravenous New Bag/Given 06/09/18 1826)  ondansetron (ZOFRAN) injection 4 mg (4 mg Intravenous Given 06/09/18 1827)  naloxone (NARCAN) injection 0.4 mg (0.4 mg Intravenous Given 06/09/18 1826)       Initial Impression / Assessment and Plan / ED Course  I have reviewed the triage vital signs and the nursing notes.  Pertinent labs & imaging results that were available during my care of the patient were reviewed by me and considered in my medical decision making (see chart for details).        27 yo F with a chief complaint of altered mental status.  Most likely this was an acute opiate overdose as the patient has had multiple of these in the past.  She did have significant improvement with Narcan administration.  She is tachycardic, will give a fluid bolus and reassess.  The patient's tachycardia has improved significantly but not resolved.  Patient is awake and alert and I discussed with her a possible laboratory work-up with her persistent tachycardia.  The patient feels that her heart rate is always fast at this point she is declining lab work and would like to go home.  I discussed with her that her risk of dying from an opiate overdose was around 10%.  She stated understanding.  I have given her a list of outpatient resources as well as a intranasal Narcan kit to take home.  CRITICAL CARE Performed by: Daniel Patrick Floyd   Total critical care time: 35 minutes  Critical care time was exclusive of separately billable procedures and treating other patients.  Critical care was  necessary to treat or prevent imminent or life-threatening deterioration.  Critical care was time spent personally by me on the following activities: development of treatment plan with patient and/or surrogate as well as nursing, discussions with consultants, evaluation of patient's response to treatment, examination of patient, obtaining history from patient or surrogate, ordering and performing treatments and interventions, ordering and review of laboratory studies, ordering and review of radiographic studies, pulse oximetry and re-evaluation of patient's condition.   7:26 PM:  I have discussed the diagnosis/risks/treatment options with the patient and believe the pt to be eligible for discharge home to follow-up with PCP. We also discussed returning to the ED immediately if new or worsening sx occur. We discussed the sx which are most concerning (e.g., sudden worsening pain, fever, inability to tolerate by mouth) that necessitate immediate return. Medications administered to the patient during their visit and any new prescriptions provided to the patient are listed below.  Medications given during this visit Medications  naloxone (NARCAN) nasal spray 4 mg/0.1 mL (has no administration in time range)  sodium chloride 0.9 % bolus 1,000 mL (1,000 mLs Intravenous New Bag/Given 06/09/18 1826)  ondansetron (ZOFRAN) injection 4 mg (4 mg Intravenous Given 06/09/18 1827)  naloxone (NARCAN) injection 0.4 mg (0.4 mg Intravenous Given 06/09/18 1826)     The patient appears reasonably screen and/or stabilized for discharge and I doubt any other medical condition or other EMC requiring further screening, evaluation, or treatment in the ED at this time prior to discharge.    Final Clinical Impressions(s) / ED Diagnoses   Final diagnoses:  Opiate overdose, accidental or unintentional, initial encounter (HCC)    ED Discharge Orders    None       Floyd, Dan, DO 06/09/18 1926    Floyd, Dan, DO  06/09/18 1927  

## 2018-06-09 NOTE — ED Triage Notes (Signed)
Pt brought in by fiancee after finding her unconscious upon arriving home. Pt has hx of heroin use, last used 3 weeks ago after abstaining for 3 years.

## 2018-06-09 NOTE — ED Notes (Signed)
Pt waiting call from boyfriend when he arrives out front of ED to pick her up.

## 2019-01-11 ENCOUNTER — Other Ambulatory Visit: Payer: Self-pay

## 2019-01-11 ENCOUNTER — Encounter (HOSPITAL_BASED_OUTPATIENT_CLINIC_OR_DEPARTMENT_OTHER): Payer: Self-pay

## 2019-01-11 ENCOUNTER — Emergency Department (HOSPITAL_BASED_OUTPATIENT_CLINIC_OR_DEPARTMENT_OTHER)
Admission: EM | Admit: 2019-01-11 | Discharge: 2019-01-12 | Disposition: A | Discharge status: Home / Self Care | Payer: Medicaid Other | Attending: Emergency Medicine | Admitting: Emergency Medicine

## 2019-01-11 DIAGNOSIS — F329 Major depressive disorder, single episode, unspecified: Secondary | ICD-10-CM | POA: Diagnosis not present

## 2019-01-11 DIAGNOSIS — Z3A33 33 weeks gestation of pregnancy: Secondary | ICD-10-CM | POA: Diagnosis not present

## 2019-01-11 DIAGNOSIS — Z3490 Encounter for supervision of normal pregnancy, unspecified, unspecified trimester: Secondary | ICD-10-CM

## 2019-01-11 DIAGNOSIS — O368131 Decreased fetal movements, third trimester, fetus 1: Secondary | ICD-10-CM | POA: Insufficient documentation

## 2019-01-11 DIAGNOSIS — Z87891 Personal history of nicotine dependence: Secondary | ICD-10-CM | POA: Insufficient documentation

## 2019-01-11 DIAGNOSIS — O99340 Other mental disorders complicating pregnancy, unspecified trimester: Secondary | ICD-10-CM | POA: Diagnosis not present

## 2019-01-11 DIAGNOSIS — J45909 Unspecified asthma, uncomplicated: Secondary | ICD-10-CM | POA: Insufficient documentation

## 2019-01-11 DIAGNOSIS — O9952 Diseases of the respiratory system complicating childbirth: Secondary | ICD-10-CM | POA: Insufficient documentation

## 2019-01-11 DIAGNOSIS — O368130 Decreased fetal movements, third trimester, not applicable or unspecified: Secondary | ICD-10-CM

## 2019-01-11 LAB — CBC WITH DIFFERENTIAL/PLATELET
Abs Immature Granulocytes: 0.08 10*3/uL — ABNORMAL HIGH (ref 0.00–0.07)
Basophils Absolute: 0 10*3/uL (ref 0.0–0.1)
Basophils Relative: 0 %
Eosinophils Absolute: 0.1 10*3/uL (ref 0.0–0.5)
Eosinophils Relative: 1 %
HCT: 35.9 % — ABNORMAL LOW (ref 36.0–46.0)
Hemoglobin: 11.6 g/dL — ABNORMAL LOW (ref 12.0–15.0)
Immature Granulocytes: 1 %
Lymphocytes Relative: 28 %
Lymphs Abs: 2.5 10*3/uL (ref 0.7–4.0)
MCH: 30.3 pg (ref 26.0–34.0)
MCHC: 32.3 g/dL (ref 30.0–36.0)
MCV: 93.7 fL (ref 80.0–100.0)
Monocytes Absolute: 0.8 10*3/uL (ref 0.1–1.0)
Monocytes Relative: 9 %
Neutro Abs: 5.6 10*3/uL (ref 1.7–7.7)
Neutrophils Relative %: 61 %
Platelets: 263 10*3/uL (ref 150–400)
RBC: 3.83 MIL/uL — ABNORMAL LOW (ref 3.87–5.11)
RDW: 12 % (ref 11.5–15.5)
WBC: 9.1 10*3/uL (ref 4.0–10.5)
nRBC: 0 % (ref 0.0–0.2)

## 2019-01-11 LAB — COMPREHENSIVE METABOLIC PANEL
ALT: 9 U/L (ref 0–44)
AST: 16 U/L (ref 15–41)
Albumin: 3.1 g/dL — ABNORMAL LOW (ref 3.5–5.0)
Alkaline Phosphatase: 81 U/L (ref 38–126)
Anion gap: 6 (ref 5–15)
BUN: 10 mg/dL (ref 6–20)
CO2: 23 mmol/L (ref 22–32)
Calcium: 8.5 mg/dL — ABNORMAL LOW (ref 8.9–10.3)
Chloride: 105 mmol/L (ref 98–111)
Creatinine, Ser: 0.74 mg/dL (ref 0.44–1.00)
GFR calc Af Amer: >60 mL/min (ref >60)
GFR calc non Af Amer: >60 mL/min (ref >60)
Glucose, Bld: 90 mg/dL (ref 70–99)
Potassium: 3.6 mmol/L (ref 3.5–5.1)
Sodium: 134 mmol/L — ABNORMAL LOW (ref 135–145)
Total Bilirubin: 0.3 mg/dL (ref 0.3–1.2)
Total Protein: 7 g/dL (ref 6.5–8.1)

## 2019-01-11 LAB — URINALYSIS, ROUTINE W REFLEX MICROSCOPIC
Bilirubin Urine: NEGATIVE
Glucose, UA: NEGATIVE mg/dL
Hgb urine dipstick: NEGATIVE
Ketones, ur: NEGATIVE mg/dL
Nitrite: NEGATIVE
Protein, ur: NEGATIVE mg/dL
Specific Gravity, Urine: 1.02 (ref 1.005–1.030)
pH: 8 (ref 5.0–8.0)

## 2019-01-11 LAB — URINALYSIS, MICROSCOPIC (REFLEX)

## 2019-01-11 NOTE — ED Provider Notes (Signed)
[facility] Provider Note: [name], MD, FACEP  CSN: [id number] MRN: [id number] ARRIVAL: 01/11/19 at 2248 ROOM: [facility]   CHIEF COMPLAINT  Baby Not Moving   HISTORY OF PRESENT ILLNESS  01/11/19 10:59 PM RENAI Branch is a 28 y.o. female who is [redacted] weeks pregnant with unknown two-vessel umbilical cord.  She is here because she has not felt her baby move for about 6 hours.  She denies vaginal discharge and has a vaginal discharge charge which she describes as normal for her pregnancy.  She denies any frank contractions but has been having some lesser pains which she thinks are [name], rated them about a 6 out of 10.    Past Medical History:  Diagnosis Date  . ADHD (attention deficit hyperactivity disorder)   . Anxiety   . Asthma   . Depression   . Headache(784.0)   . PCOS (polycystic ovarian syndrome)   . Seizures (HCC)     Past Surgical History:  Procedure Laterality Date  . WISDOM TOOTH EXTRACTION      Family History  Problem Relation Age of Onset  . Depression Mother   . Anxiety disorder Mother   . Autoimmune disease Mother   . Heart disease Father     Social History   Tobacco Use  . Smoking status: Former Smoker    Packs/day: 0.25    Years: 3.00    Pack years: 0.75    Types: Cigarettes  . Smokeless tobacco: Never Used  Substance Use Topics  . Alcohol use: Not Currently  . 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 Authorizing Provider  ALPRAZolam Prudy Feeler) 1 MG tablet Take 1 mg by mouth daily. 04/28/18   [provider]  amphetamine-dextroamphetamine (ADDERALL XR) 30 MG 24 hr capsule Take 30 mg by mouth 2 (two) times daily as needed (focus).     [provider]  cyclobenzaprine (FLEXERIL) 5 MG tablet Take 1 tablet (5 mg total) by mouth 3 (three) times daily as needed for muscle spasms. Patient not taking:  Reported on 05/09/2018 10/01/17   [name], MD  diphenhydrAMINE (BENADRYL) 25 MG tablet Take 25 mg by mouth daily as needed for allergies.    [provider]  metFORMIN (GLUCOPHAGE) 1000 MG tablet Take 1,000 mg by mouth 2 (two) times daily. 05/07/18   [provider]  ondansetron (ZOFRAN) 4 MG tablet Take 1 tablet (4 mg total) by mouth every 8 (eight) hours as needed for nausea or vomiting. Patient not taking: Reported on 05/09/2018 10/03/17   [name], MD    Allergies Bee venom   REVIEW OF SYSTEMS  Negative except as noted here or in the History of Present Illness.   PHYSICAL EXAMINATION  Initial Vital Signs Blood pressure (!) 142/90, pulse 82, temperature 98 F (36.7 C), temperature source Oral, resp. rate 18, height 5\' 4"  (1.626 m), weight 84.4 kg, SpO2 99 %.  Examination General: Well-developed, well-nourished female in no acute distress; appearance consistent with age of record HENT: normocephalic; atraumatic Eyes: pupils equal, round and reactive to light; extraocular muscles intact Neck: supple Heart: regular rate and rhythm Lungs: clear to auscultation bilaterally Abdomen: soft; gravid, consistent with dates; bowel sounds present; normal-appearing fetal movements and fetal heart tones of 151 seen on bedside ultrasound Extremities: No deformity; full range of motion; pulses normal Neurologic:  Awake, alert and oriented; motor function intact in all extremities and symmetric; no facial droop Skin: Warm and dry Psychiatric: Normal mood and affect   RESULTS  Summary of this visit's results, reviewed and interpreted by myself:   EKG Interpretation  Date/Time:    Ventricular Rate:    PR Interval:    QRS Duration:   QT Interval:    QTC Calculation:   R Axis:     Text Interpretation:        Laboratory Studies: Results for orders placed or performed during the hospital encounter of 01/11/19 (from the past 24 hour(s))  Urinalysis, Routine  w reflex microscopic     Status: Abnormal   Collection Time: 01/11/19 11:05 PM  Result Value Ref Range   Color, Urine YELLOW YELLOW   APPearance CLOUDY (A) CLEAR   Specific Gravity, Urine 1.020 1.005 - 1.030   pH 8.0 5.0 - 8.0   Glucose, UA NEGATIVE NEGATIVE mg/dL   Hgb urine dipstick NEGATIVE NEGATIVE   Bilirubin Urine NEGATIVE NEGATIVE   Ketones, ur NEGATIVE NEGATIVE mg/dL   Protein, ur NEGATIVE NEGATIVE mg/dL   Nitrite NEGATIVE NEGATIVE   Leukocytes,Ua MODERATE (A) NEGATIVE  Urinalysis, Microscopic (reflex)     Status: Abnormal   Collection Time: 01/11/19 11:05 PM  Result Value Ref Range   RBC / HPF 0-5 0 - 5 RBC/hpf   WBC, UA 11-20 0 - 5 WBC/hpf   Bacteria, UA MANY (A) NONE SEEN   Squamous Epithelial / LPF 21-50 0 - 5  CBC with Differential     Status: Abnormal   Collection Time: 01/11/19 11:09 PM  Result Value Ref Range   WBC 9.1 4.0 - 10.5 K/uL   RBC 3.83 (L) 3.87 - 5.11 MIL/uL   Hemoglobin 11.6 (L) 12.0 - 15.0 g/dL   HCT 35.9 (L) 36.0 - 46.0 %   MCV 93.7 80.0 - 100.0 fL   MCH 30.3 26.0 - 34.0 pg   MCHC 32.3 30.0 - 36.0 g/dL   RDW 12.0 11.5 - 15.5 %   Platelets 263 150 - 400 K/uL   nRBC 0.0 0.0 - 0.2 %   Neutrophils Relative % 61 %   Neutro Abs 5.6 1.7 - 7.7 K/uL   Lymphocytes Relative 28 %   Lymphs Abs 2.5 0.7 - 4.0 K/uL   Monocytes Relative 9 %   Monocytes Absolute 0.8 0.1 - 1.0 K/uL   Eosinophils Relative 1 %   Eosinophils Absolute 0.1 0.0 - 0.5 K/uL   Basophils Relative 0 %   Basophils Absolute 0.0 0.0 - 0.1 K/uL   Immature Granulocytes 1 %   Abs Immature Granulocytes 0.08 (H) 0.00 - 0.07 K/uL  Comprehensive metabolic panel     Status: Abnormal   Collection Time: 01/11/19 11:09 PM  Result Value Ref Range   Sodium 134 (L) 135 - 145 mmol/L   Potassium 3.6 3.5 - 5.1 mmol/L   Chloride 105 98 - 111 mmol/L   CO2 23 22 - 32 mmol/L   Glucose, Bld 90 70 - 99 mg/dL   BUN 10 6 - 20 mg/dL   Creatinine, Ser 0.74 0.44 - 1.00 mg/dL   Calcium 8.5 (L) 8.9 - 10.3 mg/dL    Total Protein 7.0 6.5 - 8.1 g/dL   Albumin 3.1 (L) 3.5 - 5.0 g/dL   AST 16 15 - 41 U/L   ALT 9 0 - 44 U/L   Alkaline Phosphatase 81 38 - 126 U/L   Total Bilirubin 0.3 0.3 -  1.2 mg/dL   GFR calc non Af Amer >60 >60 mL/min   GFR calc Af Amer >60 >60 mL/min   Anion gap 6 5 - 15   Imaging Studies: No results found.  ED COURSE and MDM  Nursing notes, initial and subsequent vitals signs, including pulse oximetry, reviewed and interpreted by myself.  Vitals:   01/11/19 2256 01/11/19 2325 01/12/19 0002  BP: (!) 142/90 108/77 105/78  Pulse: 82    Resp: 18    Temp: 98 F (36.7 C)    TempSrc: Oral    SpO2: 99%    Weight: 84.4 kg    Height: 5\' 4"  (1.626 m)     Medications - No data to display  12:20 AM Patient is toco strip [facility] and deemed reassuring.  Patient's blood pressure is 108/77 with no evidence of HELLP syndrome on laboratory work.  PROCEDURES  Procedures   ED DIAGNOSES     ICD-10-CM   1. Decreased fetal movements in third trimester, single or unspecified fetus  O36.8130   2. Intrauterine pregnancy  Z34.90        Georganne Siple, MD 01/12/19 [phone]

## 2019-01-11 NOTE — ED Notes (Signed)
Kristin with rapid response notified of patient placement.

## 2019-01-11 NOTE — Progress Notes (Addendum)
OB RR RN called regarding [redacted]w[redacted]d pregnant patient. G2p0. Pt reports feeling no fetal movement x6 hours. Denies any contractions, leaking of fluid, or bleeding.  Bedside ultrasound performed by EDP, fetal movement noted.  FHR 145 at time of initiating EFM, currently on the monitor.  Pt reports that she has a 2 vessel cord.  Elevated BP noted on admission, RN will recheck.  Pt sees Dr. Hazeline Junker at [facility], will notify faculty practice attending MD on call.  Addendum 01/12/19 @ 0017: Reactive NST with baseline of 115, notified Dr. Elonda Husky, pt is cleared from an OB standpoint.  Primary RN notified.

## 2019-01-11 NOTE — ED Triage Notes (Signed)
Pt is [redacted] weeks pregnant -states she has not felt baby move x 6 hours-denies vaginal bleeding pos d/c "but that's been normal"-OB Dr Tyler Deis in [facility]-steady gait

## 2019-01-11 NOTE — ED Notes (Signed)
Pt denies pain at this time. Significant other at bedside. Plan of care explained.

## 2019-01-11 NOTE — ED Notes (Signed)
ED Provider at bedside. 

## 2019-10-20 IMAGING — CT CT HEAD WITHOUT CONTRAST
3 series · 15 of 47 positions shown, 18 images · non-contrast
Comparison: CT HEAD August 08, 2014

CLINICAL DATA: Suspected drug overdose, struck head on furniture.
LEFT periorbital laceration.

EXAM:
CT HEAD WITHOUT CONTRAST
TECHNIQUE: Contiguous axial images were obtained from the base of the skull
through the vertex without intravenous contrast.

[Series 2: head wo · axial · 0.45mm/px · z∈[-126,-1]mm · 9 of 30 slices shown, 12 images]
[im 3/30  brain]
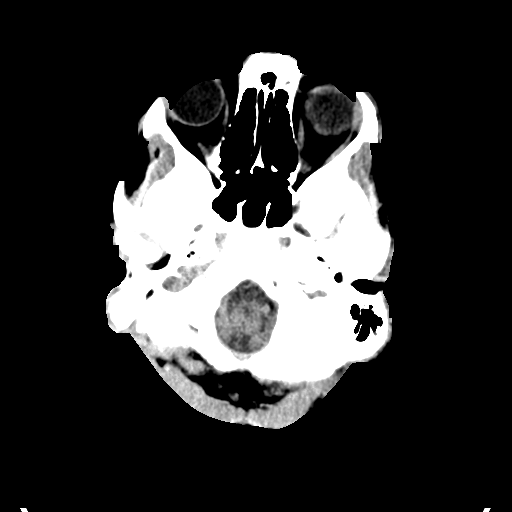
[im 3/30  bone]
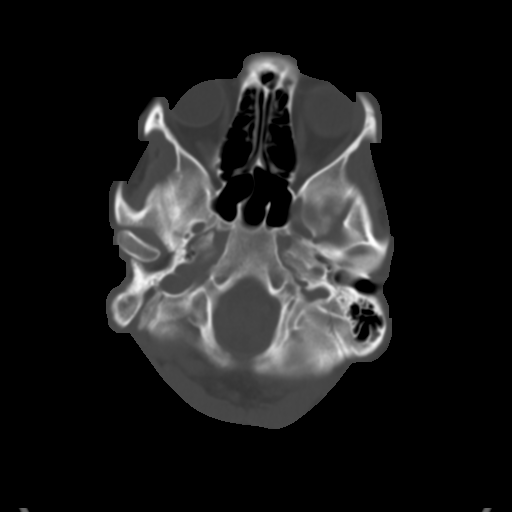
[im 6/30  brain]
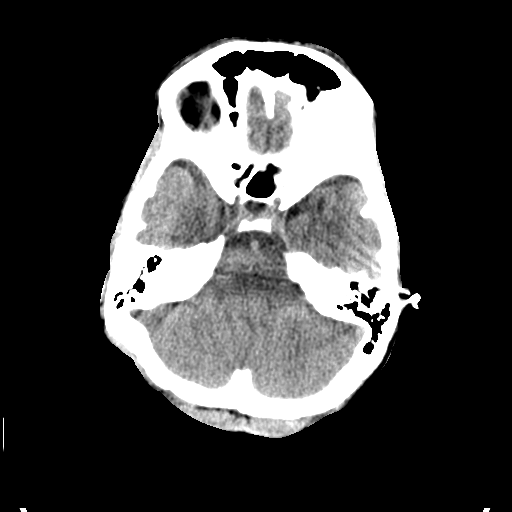
[im 9/30  brain]
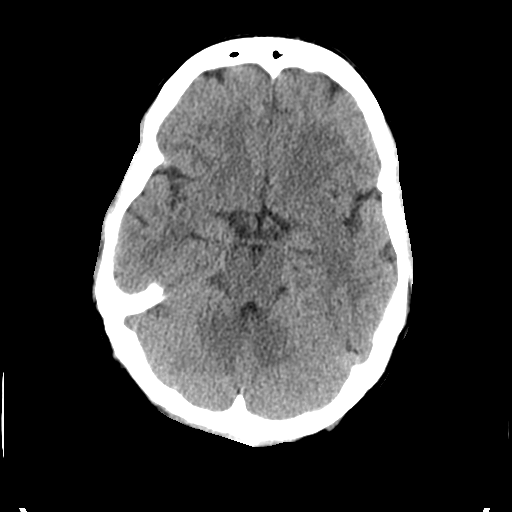
[im 12/30  brain]
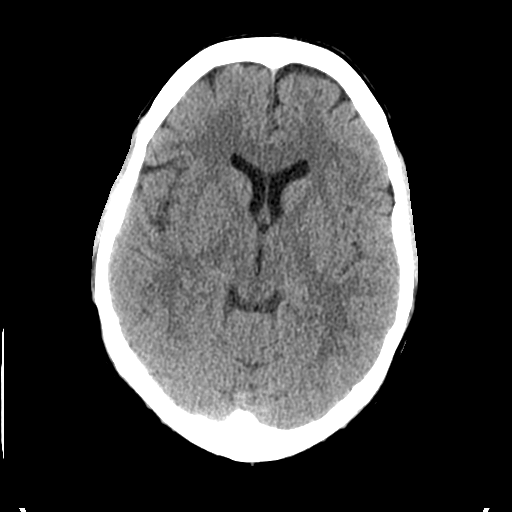
[im 16/30  brain]
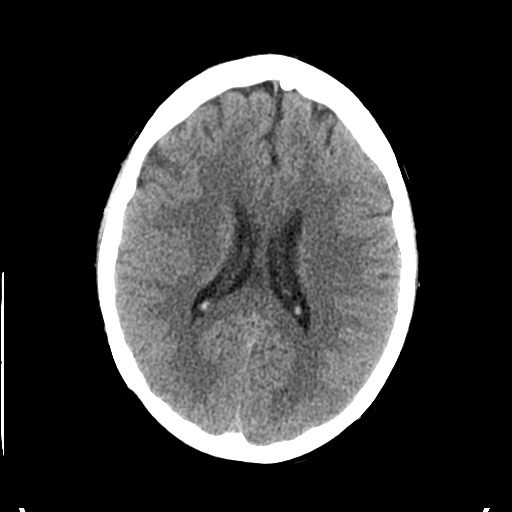
[im 16/30  bone]
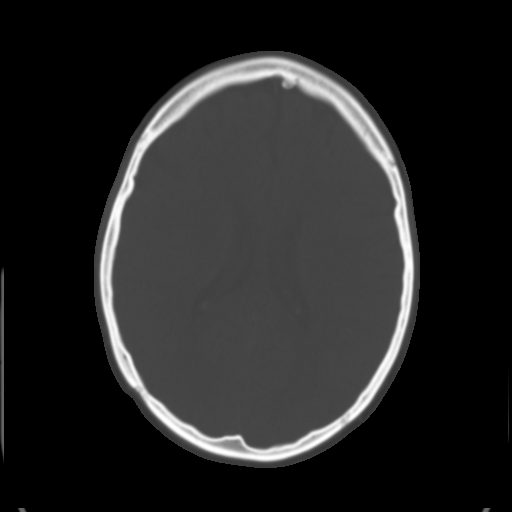
[im 19/30  brain]
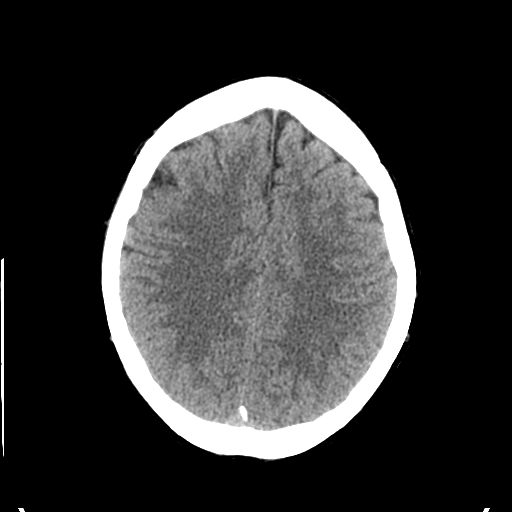
[im 22/30  brain]
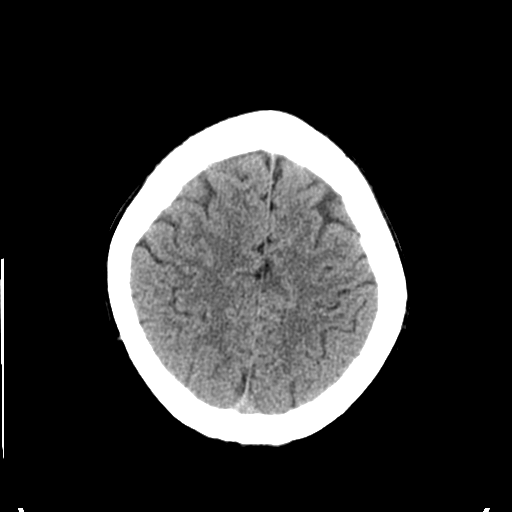
[im 25/30  brain]
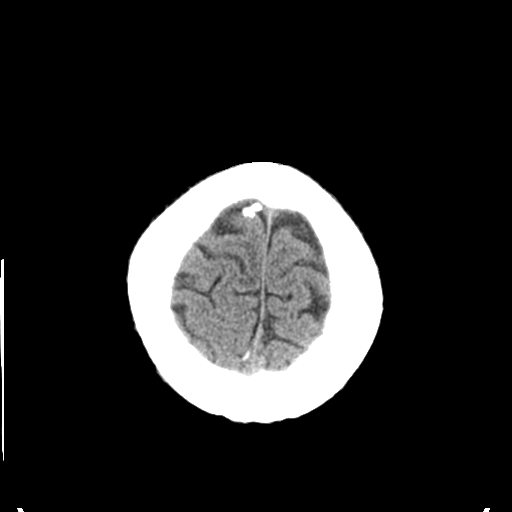
[im 28/30  brain]
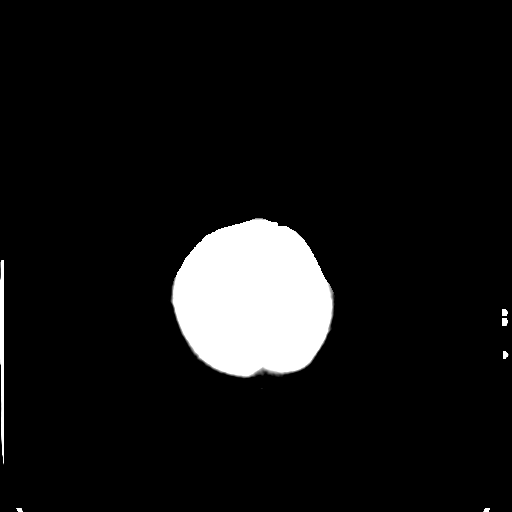
[im 28/30  bone]
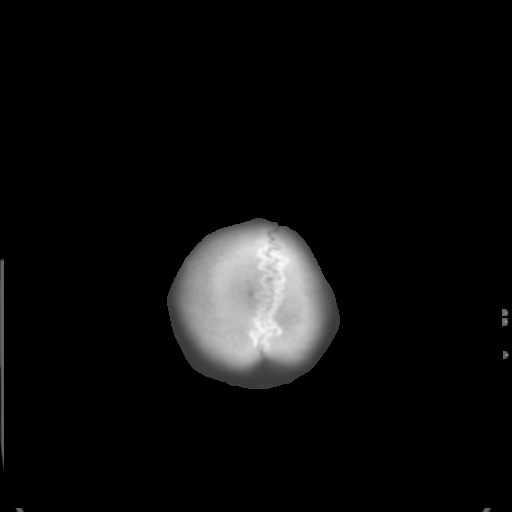

[Series 4: coronal soft tissue · coronal · 0.29mm/px · 3 of 69 slices shown]
[im 23/69  brain]
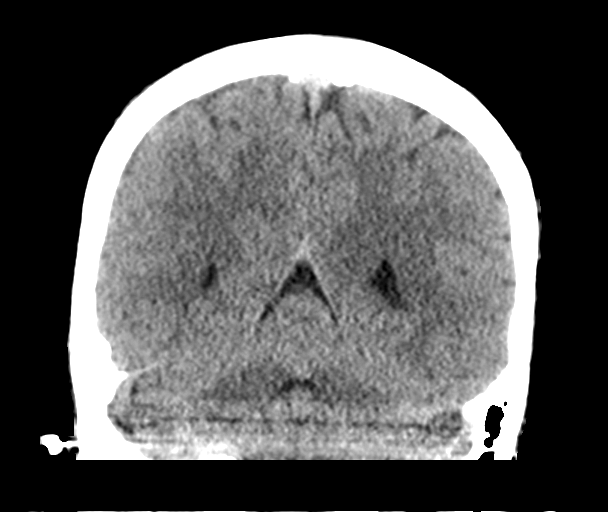
[im 31/69  brain]
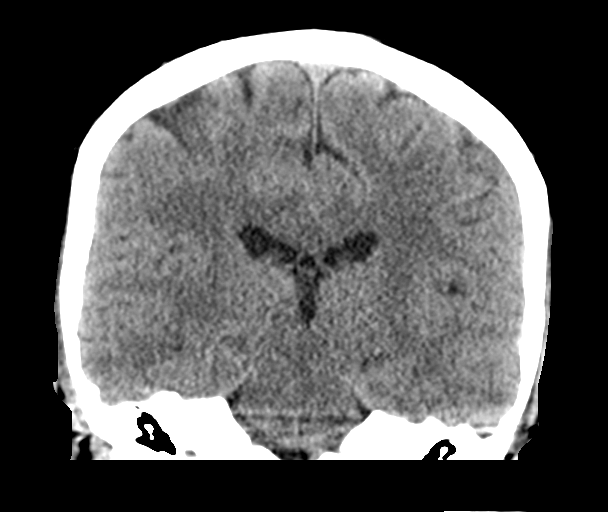
[im 38/69  brain]
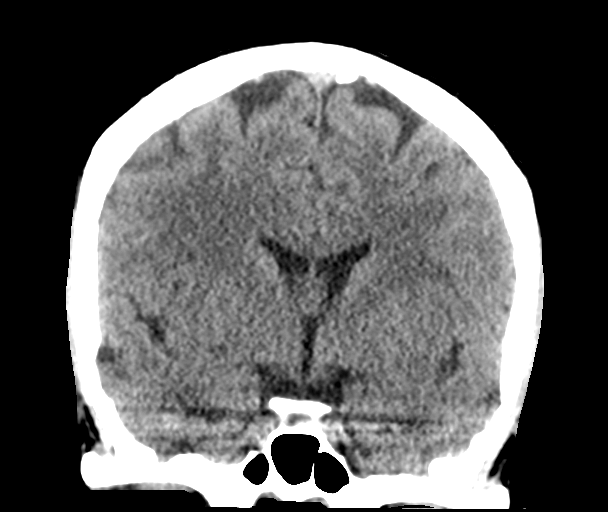

[Series 5: sagittal soft tissue · sagittal · 0.30mm/px · 3 of 55 slices shown]
[im 19/55  brain]
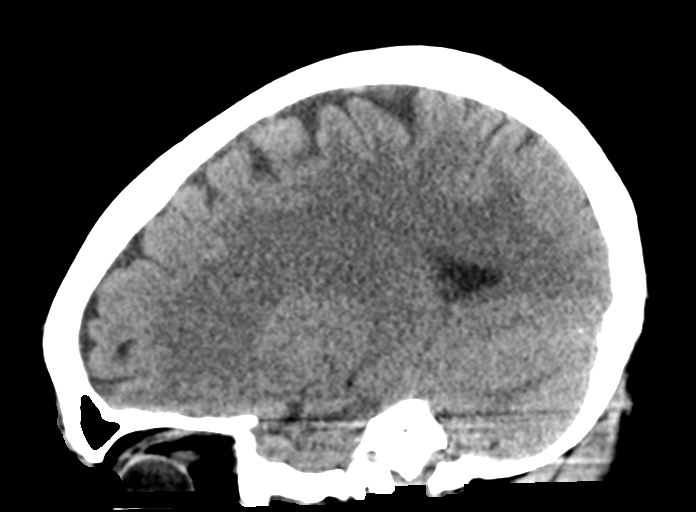
[im 28/55  brain]
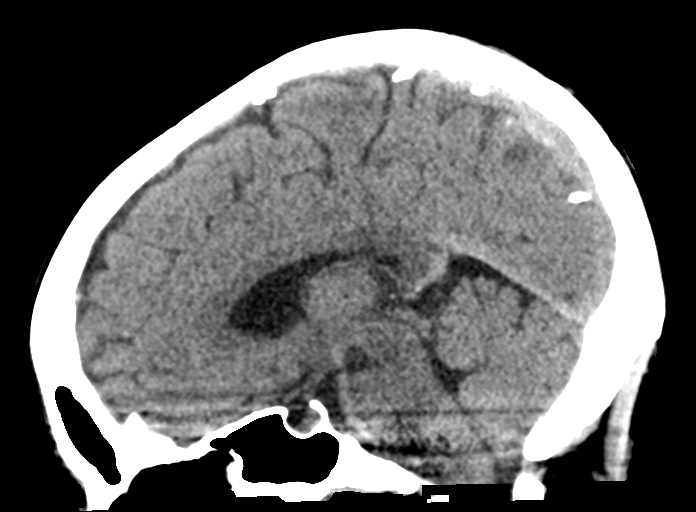
[im 37/55  brain]
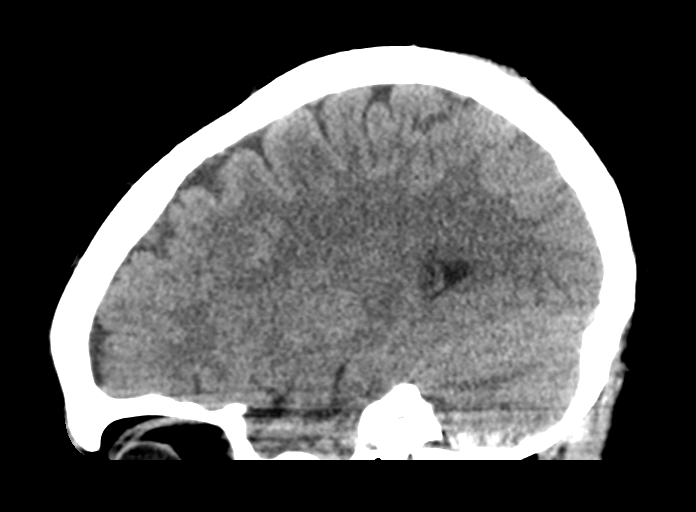

[15 of 47 positions shown; findings below may reference images not displayed]

FINDINGS: BRAIN: No intraparenchymal hemorrhage, mass effect nor midline
shift. The ventricles and sulci are normal. No acute large vascular
territory infarcts. No abnormal extra-axial fluid collections. Basal
cisterns are patent.

VASCULAR: Unremarkable.

SKULL/SOFT TISSUES: No skull fracture. Subcentimeter LEFT frontal
bony excrescence from inner table seen with meningioma or
hyperostosis frontalis internus. Small LEFT frontal scalp hematoma
with skin defect. No radiopaque foreign bodies.

ORBITS/SINUSES: The included ocular globes and orbital contents are
normal.Trace paranasal sinus mucosal thickening. Mastoid air cells
are well aerated.

OTHER: None.
IMPRESSION: 1. No acute intracranial process. Small LEFT frontal scalp hematoma
and laceration.
2. Small LEFT frontal potential meningioma without mass effect.
Otherwise negative noncontrast CT HEAD.
